# Patient Record
Sex: Female | Born: 1975
Health system: Southern US, Community
[De-identification: ages and names within clinical notes are randomized; demographics above are authoritative.]

## PROBLEM LIST (undated history)

## (undated) DIAGNOSIS — E039 Hypothyroidism, unspecified: Secondary | ICD-10-CM

## (undated) DIAGNOSIS — E559 Vitamin D deficiency, unspecified: Secondary | ICD-10-CM

## (undated) DIAGNOSIS — Z1371 Encounter for nonprocreative screening for genetic disease carrier status: Secondary | ICD-10-CM

## (undated) DIAGNOSIS — E049 Nontoxic goiter, unspecified: Secondary | ICD-10-CM

## (undated) DIAGNOSIS — Z803 Family history of malignant neoplasm of breast: Secondary | ICD-10-CM

## (undated) DIAGNOSIS — Z9189 Other specified personal risk factors, not elsewhere classified: Secondary | ICD-10-CM

## (undated) HISTORY — DX: Nontoxic goiter, unspecified: E04.9

## (undated) HISTORY — DX: Other specified personal risk factors, not elsewhere classified: Z91.89

## (undated) HISTORY — DX: Hypothyroidism, unspecified: E03.9

## (undated) HISTORY — DX: Family history of malignant neoplasm of breast: Z80.3

## (undated) HISTORY — DX: Vitamin D deficiency, unspecified: E55.9

## (undated) HISTORY — DX: Encounter for nonprocreative screening for genetic disease carrier status: Z13.71

---

## 2006-07-08 ENCOUNTER — Encounter: Payer: Self-pay | Admitting: Maternal & Fetal Medicine

## 2006-08-21 ENCOUNTER — Emergency Department: Payer: Self-pay | Admitting: Emergency Medicine

## 2006-08-30 ENCOUNTER — Emergency Department: Payer: Self-pay | Admitting: Emergency Medicine

## 2006-09-27 ENCOUNTER — Encounter: Payer: Self-pay | Admitting: Maternal & Fetal Medicine

## 2006-10-21 ENCOUNTER — Encounter: Payer: Self-pay | Admitting: Obstetrics and Gynecology

## 2006-11-11 ENCOUNTER — Encounter: Payer: Self-pay | Admitting: Obstetrics and Gynecology

## 2006-11-11 ENCOUNTER — Inpatient Hospital Stay: Payer: Self-pay

## 2007-01-31 HISTORY — PX: TUBAL LIGATION: SHX77

## 2007-07-27 ENCOUNTER — Other Ambulatory Visit: Payer: Self-pay

## 2007-07-28 ENCOUNTER — Inpatient Hospital Stay: Payer: Self-pay | Admitting: Internal Medicine

## 2007-07-28 ENCOUNTER — Inpatient Hospital Stay: Payer: Self-pay | Admitting: Psychiatry

## 2008-01-02 IMAGING — US US OB DETAIL+14 WK - NRPT [facility]
1 series · 14 of 28 positions shown · non-contrast
Comparison: none

[Series 1: us ob detail+14 wk - nrpt [facility] · 0.31mm/px · 14 of 73 slices shown]
[im 3/73]
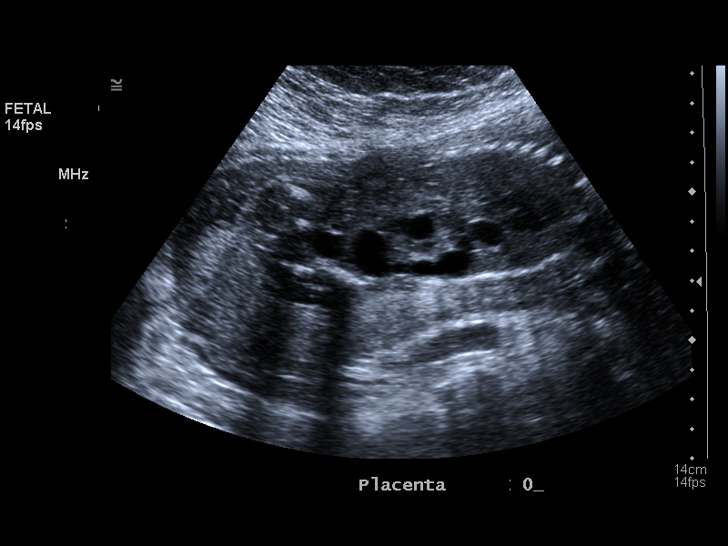
[im 9/73]
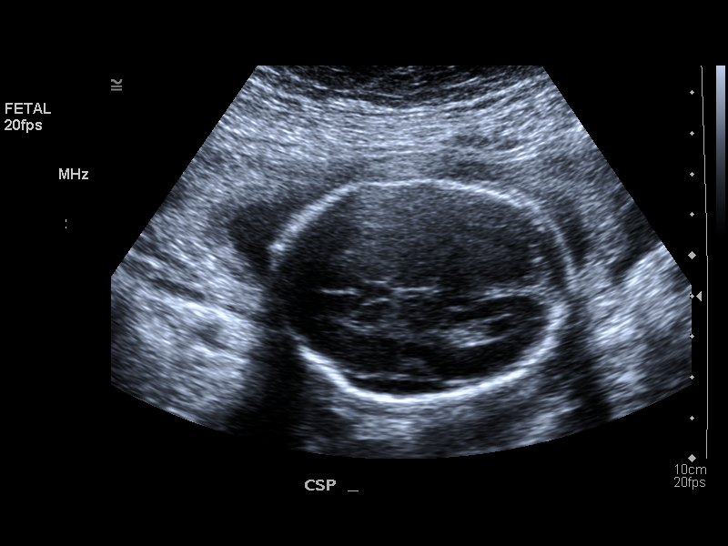
[im 14/73]
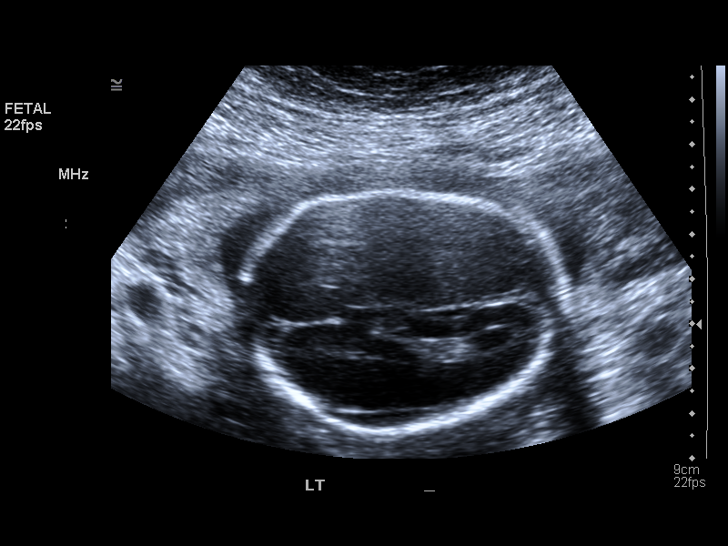
[im 19/73]
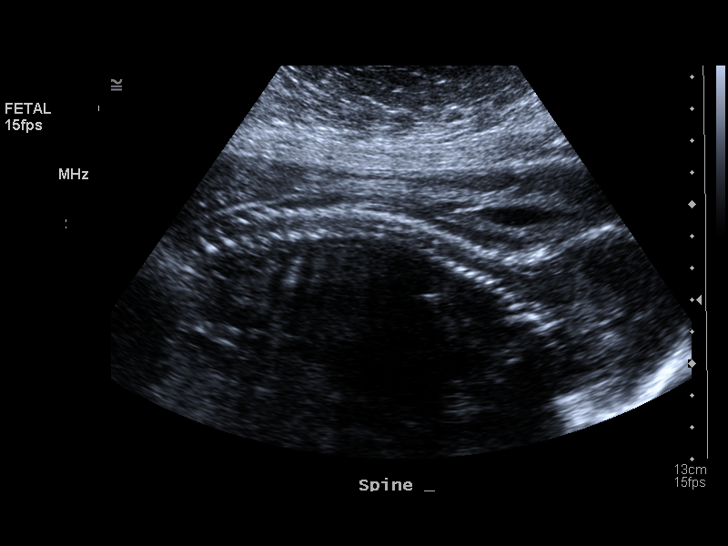
[im 25/73]
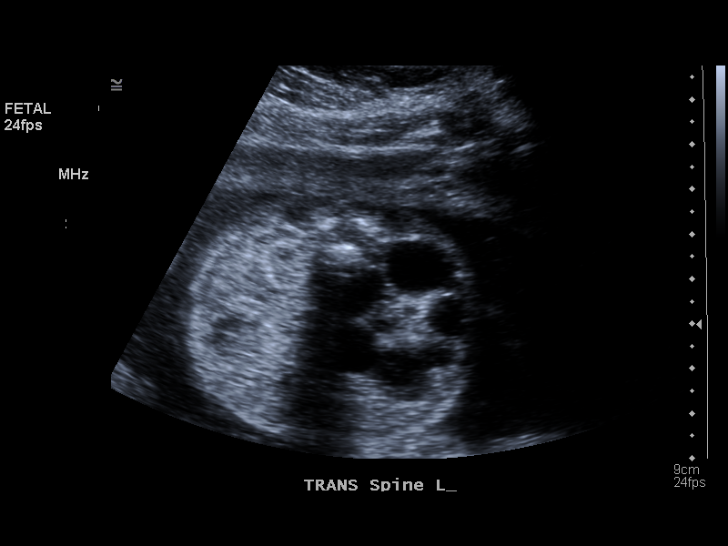
[im 30/73]
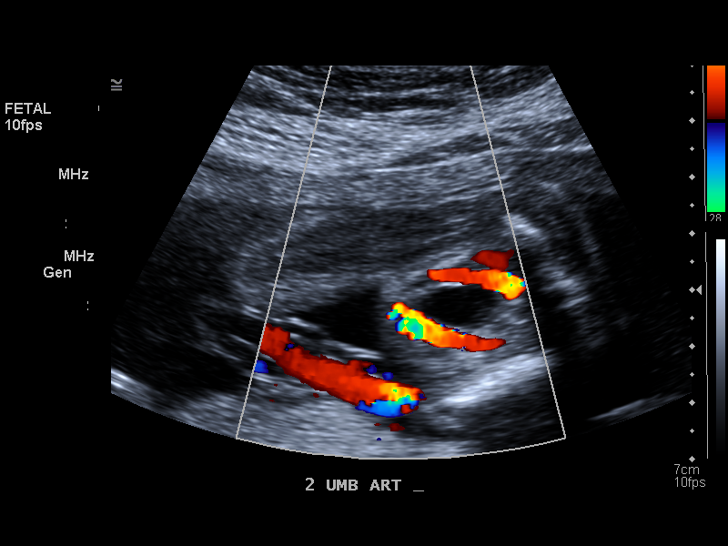
[im 35/73]
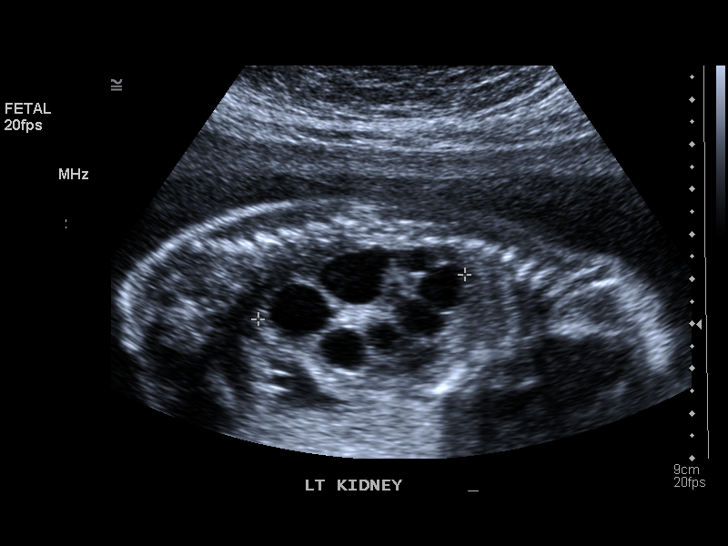
[im 41/73]
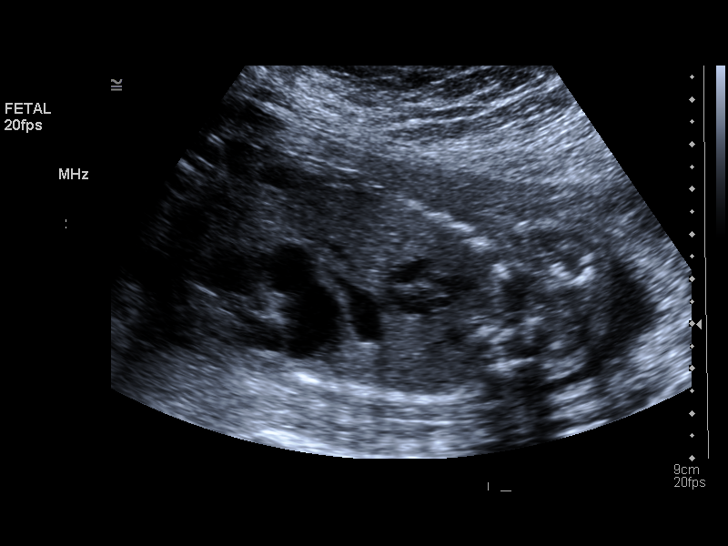
[im 46/73]
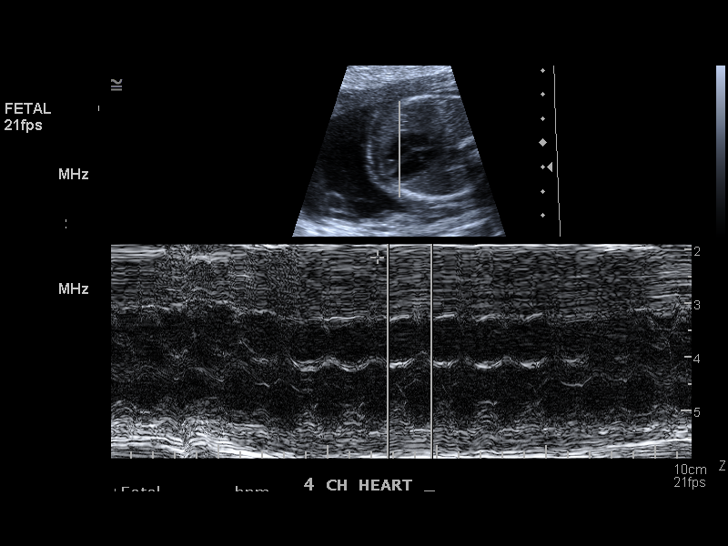
[im 51/73]
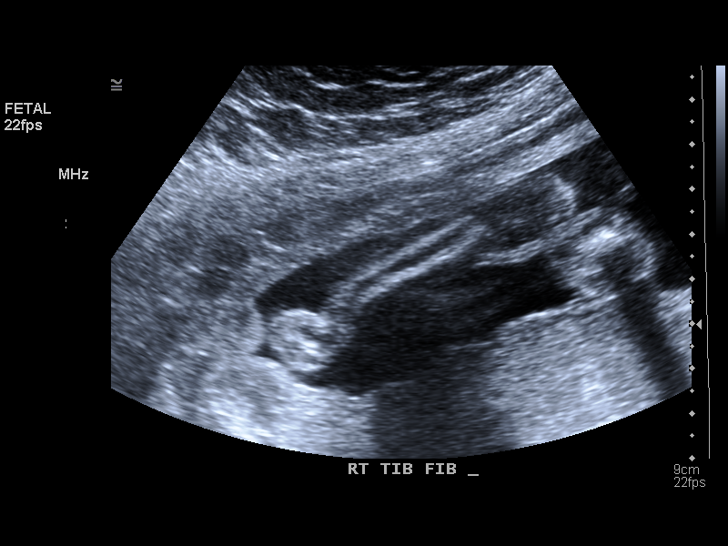
[im 57/73]
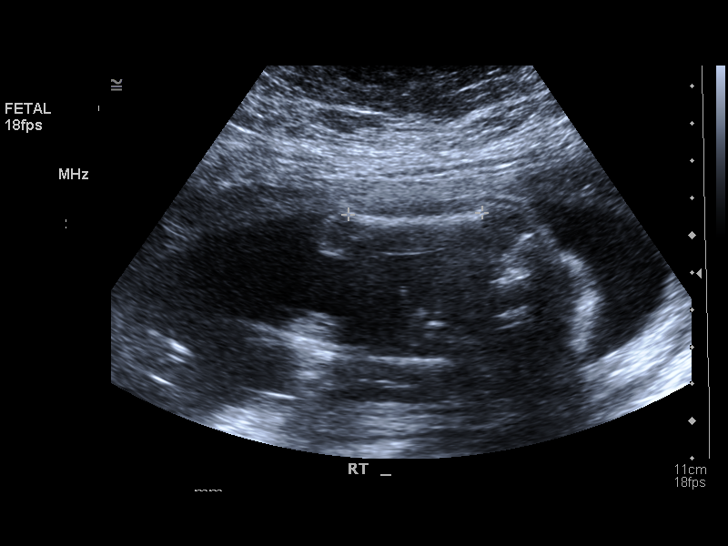
[im 62/73]
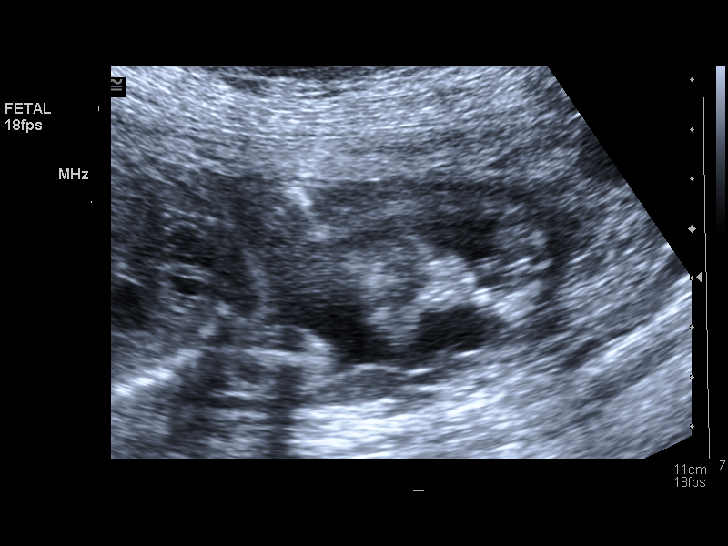
[im 67/73]
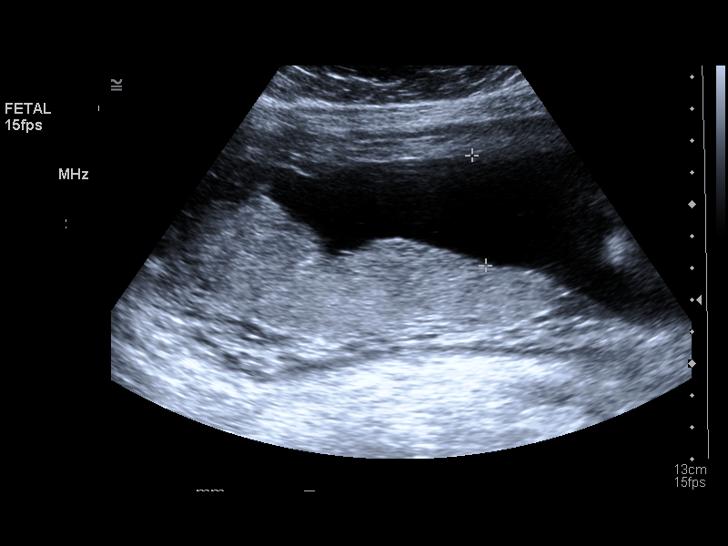
[im 73/73]
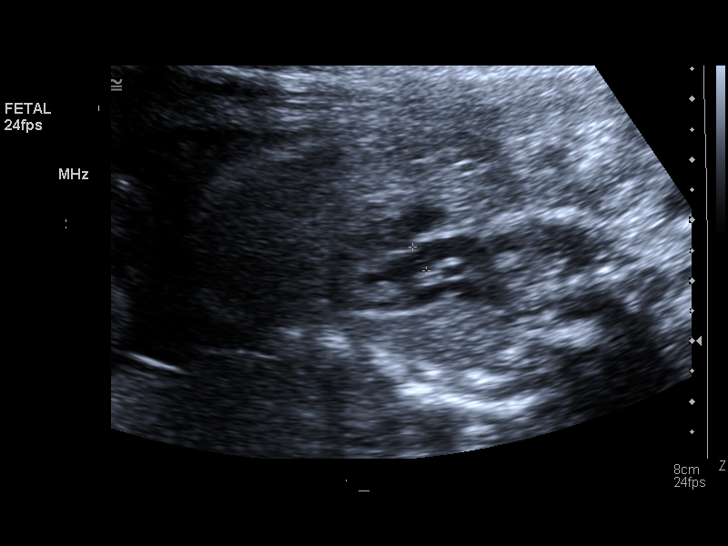

[14 of 28 positions shown; findings below may reference images not displayed]

IMAGES IMPORTED FROM THE [employer] WORKFLOW SYSTEM
NO DICTATION FOR STUDY

## 2008-05-07 IMAGING — US ULTRAOUND OB LIMITED - [facility]PT [facility]
1 series · 14 of 28 positions shown · non-contrast
Comparison: none

[Series 1: ultraound ob limited - [facility] · 0.41mm/px · 14 of 32 slices shown]
[im 2/32]
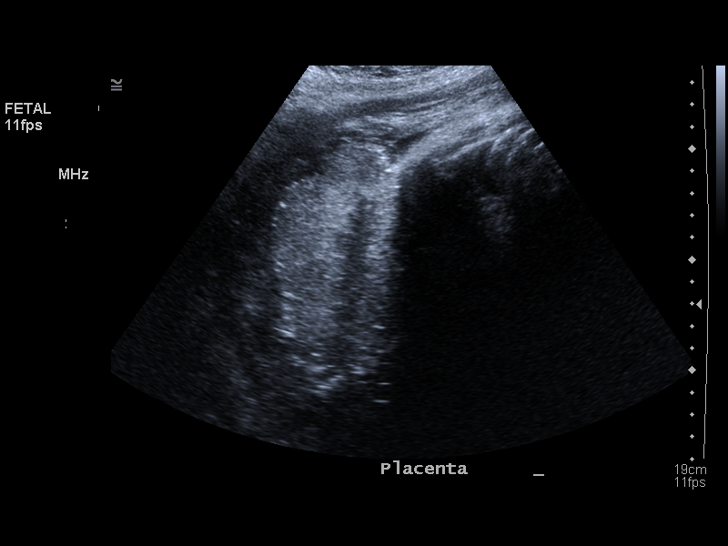
[im 4/32]
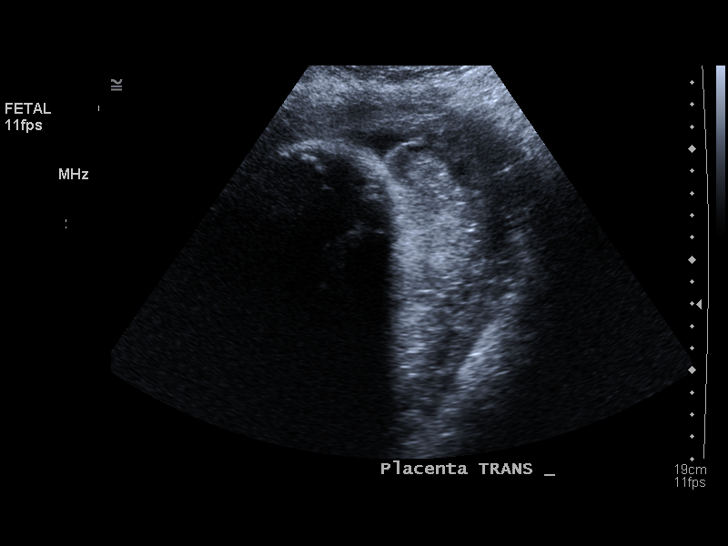
[im 6/32]
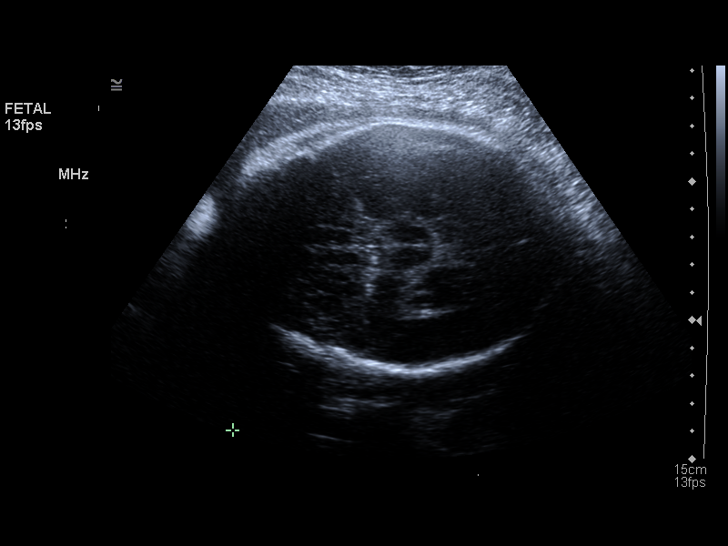
[im 9/32]
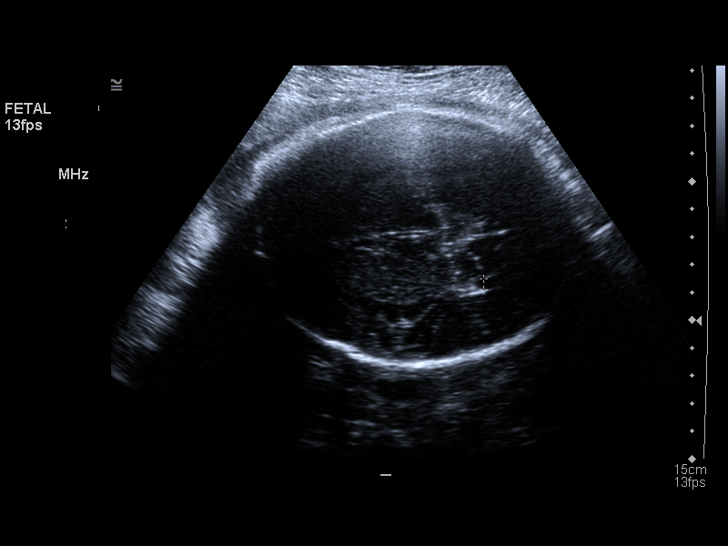
[im 11/32]
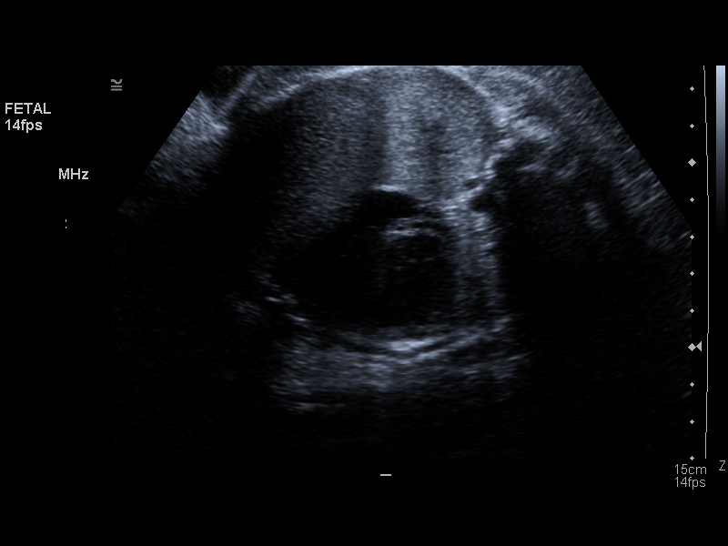
[im 13/32]
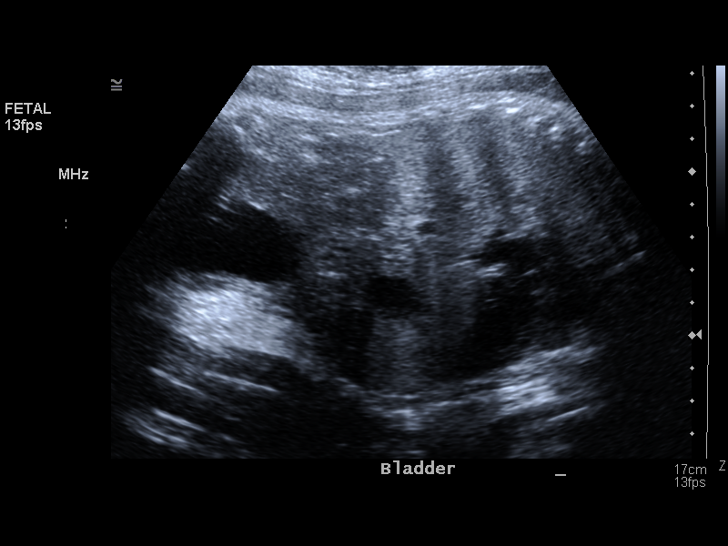
[im 15/32]
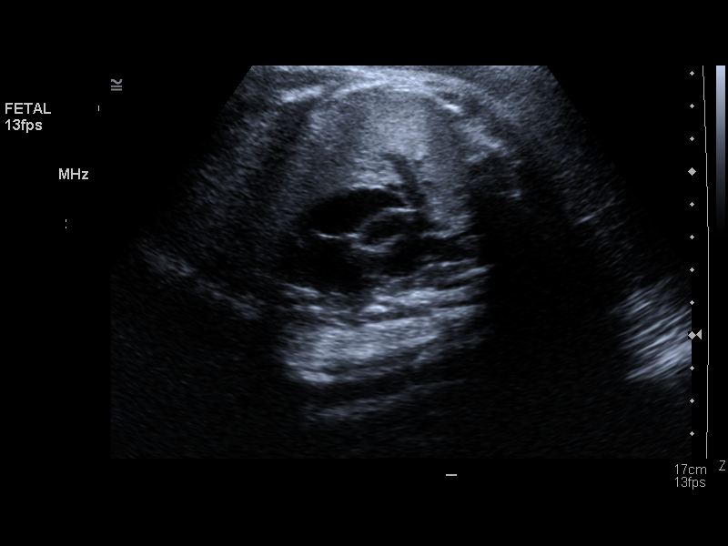
[im 18/32]
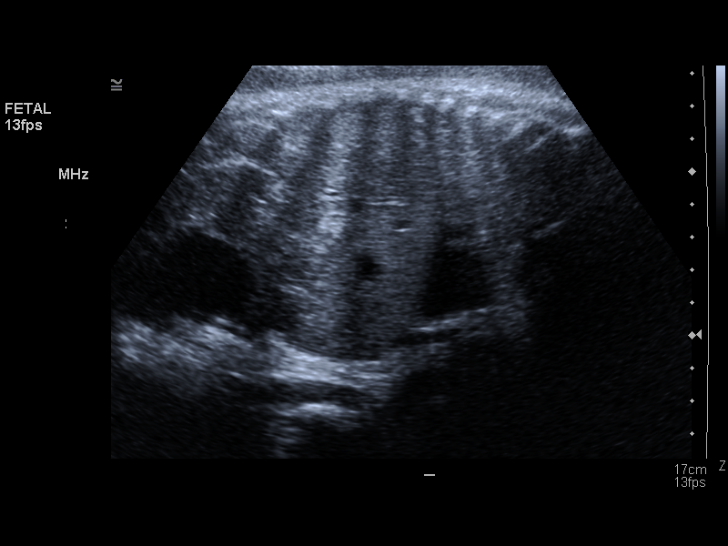
[im 20/32]
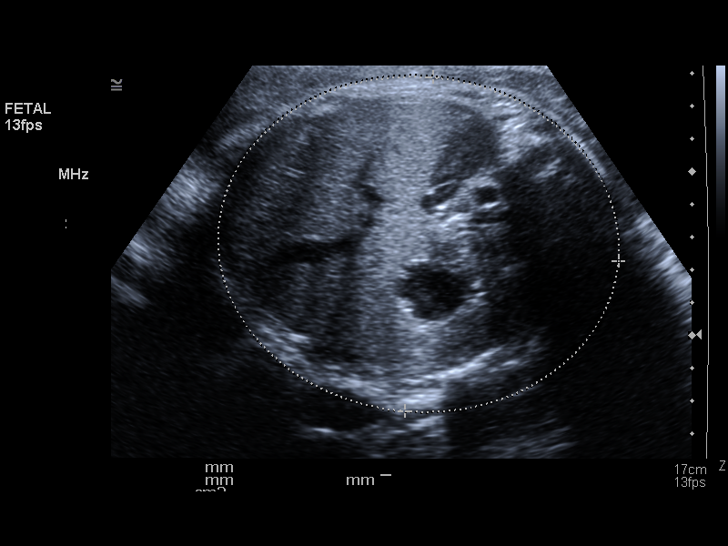
[im 22/32]
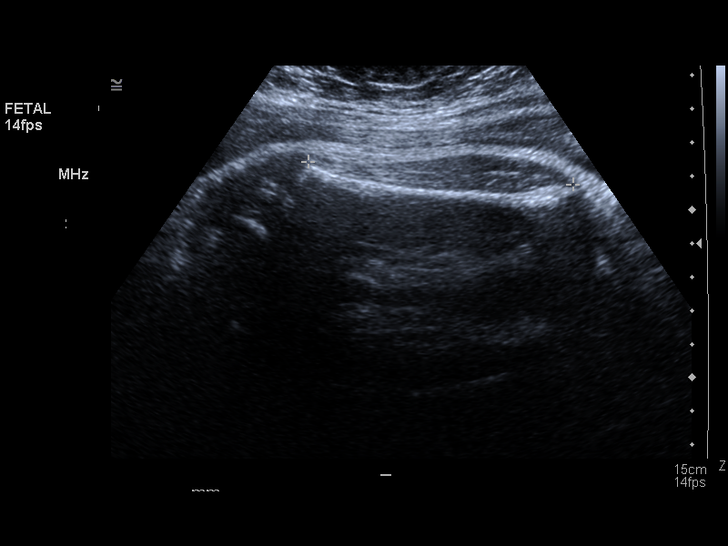
[im 25/32]
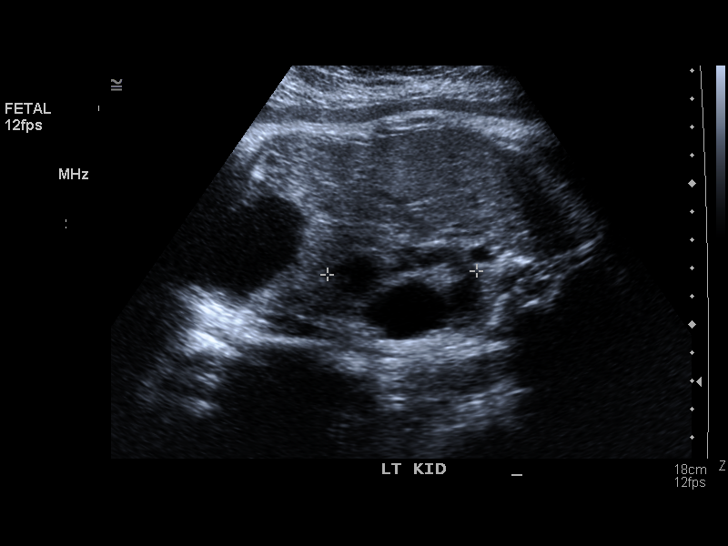
[im 27/32]
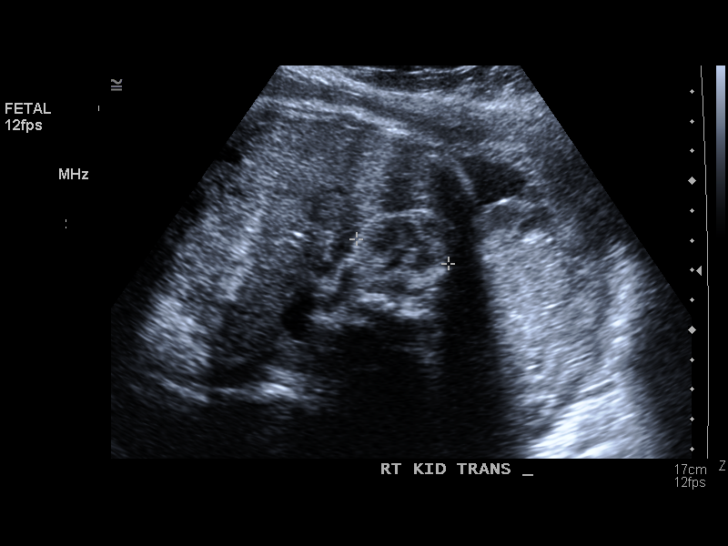
[im 29/32]
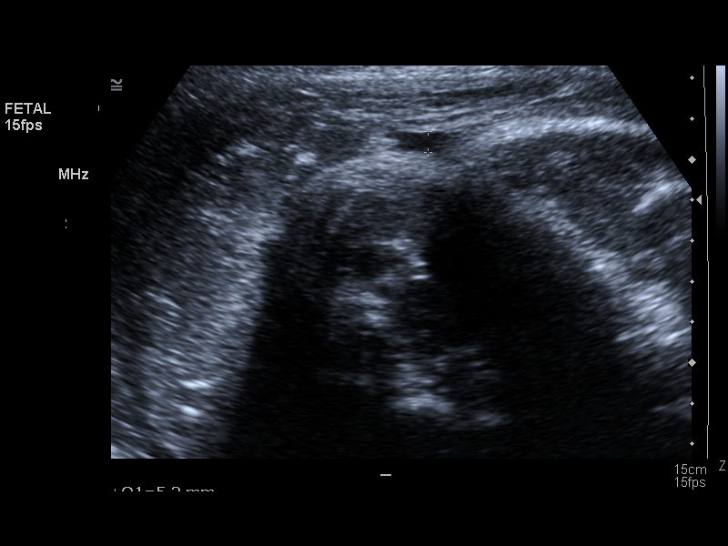
[im 32/32]
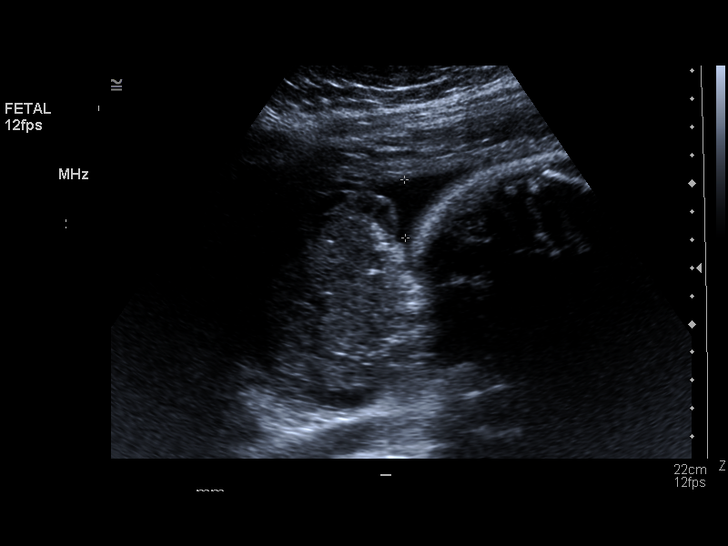

[14 of 28 positions shown; findings below may reference images not displayed]

IMAGES IMPORTED FROM THE SYNGO WORKFLOW SYSTEM
NO DICTATION FOR STUDY

## 2009-01-21 IMAGING — CR DG CHEST 1V PORT
1 series · 1 of 1 positions shown · non-contrast
Comparison: none

REASON FOR EXAM: overdose, rule out aspiration
COMMENTS:

[view not recorded]
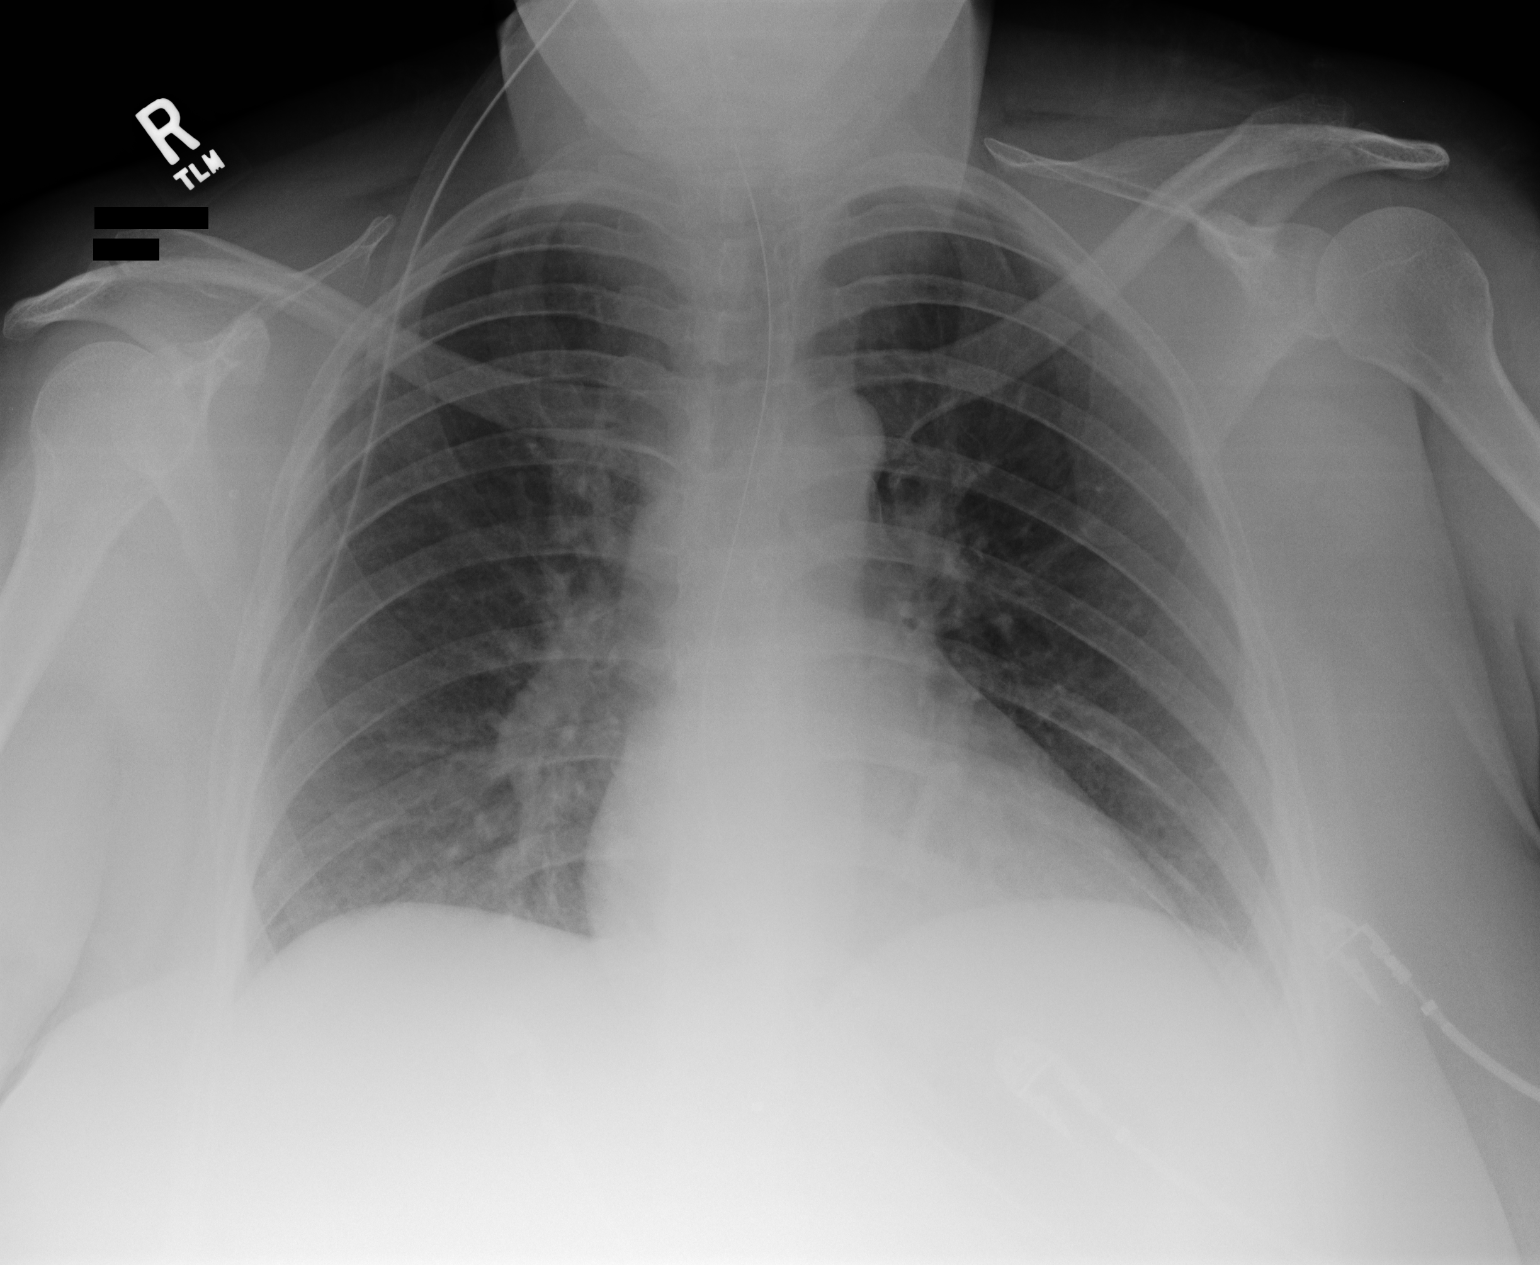

[1 of 1 positions shown; findings below may reference images not displayed]

PROCEDURE:     DXR - DXR PORTABLE CHEST SINGLE VIEW  - July 28, 2007 [DATE]

RESULT:     The lungs are adequately inflated. The heart is normal in size.
The perihilar lung markings are mildly increased. There is no pleural
effusion. There is an esophagogastric tube present whose tip is not visible
on the film.
IMPRESSION: Mildly increased perihilar lung markings are nonspecific
but could reflect subsegmental atelectasis. I see no evidence of focal
pneumonia. A followup PA and lateral chest x-ray would be of value when the
patient can tolerate the procedure.

## 2009-07-22 ENCOUNTER — Encounter: Payer: Self-pay | Admitting: Obstetrics & Gynecology

## 2010-02-05 ENCOUNTER — Observation Stay: Payer: Self-pay | Admitting: Obstetrics and Gynecology

## 2010-02-06 ENCOUNTER — Inpatient Hospital Stay: Payer: Self-pay | Admitting: Obstetrics and Gynecology

## 2010-02-11 LAB — PATHOLOGY REPORT

## 2011-03-03 DIAGNOSIS — Z9189 Other specified personal risk factors, not elsewhere classified: Secondary | ICD-10-CM

## 2011-03-03 DIAGNOSIS — Z1371 Encounter for nonprocreative screening for genetic disease carrier status: Secondary | ICD-10-CM

## 2011-03-03 HISTORY — DX: Encounter for nonprocreative screening for genetic disease carrier status: Z13.71

## 2011-03-03 HISTORY — DX: Other specified personal risk factors, not elsewhere classified: Z91.89

## 2011-06-01 DIAGNOSIS — Z803 Family history of malignant neoplasm of breast: Secondary | ICD-10-CM | POA: Insufficient documentation

## 2011-06-01 HISTORY — DX: Family history of malignant neoplasm of breast: Z80.3

## 2017-05-24 ENCOUNTER — Ambulatory Visit (INDEPENDENT_AMBULATORY_CARE_PROVIDER_SITE_OTHER): Payer: BC Managed Care – PPO | Admitting: Obstetrics and Gynecology

## 2017-05-24 ENCOUNTER — Encounter: Payer: Self-pay | Admitting: Obstetrics and Gynecology

## 2017-05-24 VITALS — BP 140/98 | HR 94 | Ht 69.0 in | Wt 276.0 lb

## 2017-05-24 DIAGNOSIS — R2232 Localized swelling, mass and lump, left upper limb: Secondary | ICD-10-CM

## 2017-05-24 DIAGNOSIS — Z803 Family history of malignant neoplasm of breast: Secondary | ICD-10-CM | POA: Diagnosis not present

## 2017-05-24 DIAGNOSIS — Z9189 Other specified personal risk factors, not elsewhere classified: Secondary | ICD-10-CM

## 2017-05-24 DIAGNOSIS — Z1239 Encounter for other screening for malignant neoplasm of breast: Secondary | ICD-10-CM

## 2017-05-24 DIAGNOSIS — E039 Hypothyroidism, unspecified: Secondary | ICD-10-CM | POA: Insufficient documentation

## 2017-05-24 DIAGNOSIS — Z1231 Encounter for screening mammogram for malignant neoplasm of breast: Secondary | ICD-10-CM | POA: Diagnosis not present

## 2017-05-24 NOTE — Progress Notes (Signed)
Copland, Briana Evener, PA-C   Chief Complaint  Patient presents with  . Breast Problem    Left lump towards under arm, sore    HPI:      [name] is a 42 y.o. [phone] who LMP was Patient's last menstrual period was 05/02/2017 (exact date)., presents today for LT axillary mass for the past few wks. Area is tender which triggered pt to notice sx. No change since first noted. No erythema/nipple d/c. She does drink caffeine. She has not had a mammogram since 12/05/14. There is a FH of breast cancer in her mom, MGM, and mat aunt. Pt and mat aunt are BRCA neg 2013. Pt hasn't had update testing. IBIS=25% 2013.   Past Medical History:  Diagnosis Date  . BRCA negative 2013   BRCA/BART neg  . Enlarged thyroid   . Family history of breast cancer 06/2011   Pt is BRCA/BART neg. IBIS=25.85% QYR mammogram/6 mo breast check MRI QYR; Mat aunt BRCA neg  . Hypothyroidism   . Increased risk of breast cancer 2013   IBIS-25%  . Vitamin D deficiency     Past Surgical History:  Procedure Laterality Date  . TUBAL LIGATION  01/2007    Family History  Problem Relation Age of Onset  . Breast cancer Mother 67  . Brain cancer Mother 36  . Breast cancer Maternal Aunt 42  . Breast cancer Maternal Grandmother 47  . Hypertension Father   . Diabetes Father   . Pancreatic cancer Maternal Uncle     Social History   Socioeconomic History  . Marital status: Married    Spouse name: Not on file  . Number of children: Not on file  . Years of education: Not on file  . Highest education level: Not on file  Occupational History  . Not on file  Social Needs  . Financial resource strain: Not on file  . Food insecurity:    Worry: Not on file    Inability: Not on file  . Transportation needs:    Medical: Not on file    Non-medical: Not on file  Tobacco Use  . Smoking status: Never Smoker  . Smokeless tobacco: Never Used  Substance and Sexual Activity  . Alcohol use: Not Currently   Frequency: Never  . Drug use: Never  . Sexual activity: Yes    Birth control/protection: Surgical    Comment: tubal   Lifestyle  . Physical activity:    Days per week: Not on file    Minutes per session: Not on file  . Stress: Not on file  Relationships  . Social connections:    Talks on phone: Not on file    Gets together: Not on file    Attends religious service: Not on file    Active member of club or organization: Not on file    Attends meetings of clubs or organizations: Not on file    Relationship status: Not on file  . Intimate partner violence:    Fear of current or ex partner: Not on file    Emotionally abused: Not on file    Physically abused: Not on file    Forced sexual activity: Not on file  Other Topics Concern  . Not on file  Social History Narrative  . Not on file    Outpatient Medications Prior to Visit  Medication Sig Dispense Refill  . ibuprofen (ADVIL,MOTRIN) 200 MG tablet Take by mouth.     No facility-administered  medications prior to visit.       ROS:  Review of Systems  Constitutional: Negative for fever.  Gastrointestinal: Negative for blood in stool, constipation, diarrhea, nausea and vomiting.  Genitourinary: Negative for dyspareunia, dysuria, flank pain, frequency, hematuria, urgency, vaginal bleeding, vaginal discharge and vaginal pain.  Musculoskeletal: Negative for back pain.  Skin: Negative for rash.   BREAST: mass/tenderness   OBJECTIVE:   Vitals:  BP (!) 140/98   Pulse 94   Ht '5\' 9"'$  (1.753 m)   Wt 276 lb (125.2 kg)   LMP 05/02/2017 (Exact Date)   BMI 40.76 kg/m   Physical Exam  Constitutional: She is oriented to person, place, and time and well-developed, well-nourished, and in no distress.  Pulmonary/Chest: Right breast exhibits no inverted nipple, no mass, no nipple discharge, no skin change and no tenderness. Left breast exhibits mass. Left breast exhibits no inverted nipple, no nipple discharge, no skin change and no  tenderness. Breasts are symmetrical.    LT BREAST AXILLARY TAIL WITH ~7 MM FIRM, MOBILE, SUPERFICIAL NODULE; TENDER TO PALPATE; NO ERYTHEMA  Lymphadenopathy:    She has no axillary adenopathy.  Neurological: She is alert and oriented to person, place, and time.  Psychiatric: Affect and judgment normal.  Vitals reviewed.   Assessment/Plan: Axillary mass, left - Feels like cystic nodule. Warm compresses/d/c shaving there. F/u prn/ at annual.  Screening for breast cancer - Pt to sched mammo since overdue.  - Plan: MM SCREENING BREAST TOMO BILATERAL  Increased risk of breast cancer - IBIS=25%. Do mammo. Pt to RTO for annual/discussion of further scr/update cancer genetic testing. - Plan: MM SCREENING BREAST TOMO BILATERAL  Family history of breast cancer - Plan: MM SCREENING BREAST TOMO BILATERAL    Return in about 1 month (around 06/10/3011) for annual.  [name], PA-C 05/24/2017 12:08 PM

## 2017-05-24 NOTE — Patient Instructions (Signed)
I value your feedback and entrusting us with your care. If you get a [place] patient survey, I would appreciate you taking the time to let us know about your experience today. Thank you! 

## 2017-06-23 ENCOUNTER — Ambulatory Visit: Payer: BC Managed Care – PPO | Admitting: Obstetrics and Gynecology

## 2018-02-05 ENCOUNTER — Emergency Department: Payer: BLUE CROSS/BLUE SHIELD

## 2018-02-05 ENCOUNTER — Emergency Department
Admission: EM | Admit: 2018-02-05 | Discharge: 2018-02-05 | Disposition: A | Discharge status: Home / Self Care | Payer: BLUE CROSS/BLUE SHIELD | Attending: Emergency Medicine | Admitting: Emergency Medicine

## 2018-02-05 ENCOUNTER — Other Ambulatory Visit: Payer: Self-pay

## 2018-02-05 ENCOUNTER — Encounter: Payer: Self-pay | Admitting: Emergency Medicine

## 2018-02-05 DIAGNOSIS — Y9301 Activity, walking, marching and hiking: Secondary | ICD-10-CM | POA: Insufficient documentation

## 2018-02-05 DIAGNOSIS — Y92008 Other place in unspecified non-institutional (private) residence as the place of occurrence of the external cause: Secondary | ICD-10-CM | POA: Diagnosis not present

## 2018-02-05 DIAGNOSIS — R51 Headache: Secondary | ICD-10-CM | POA: Diagnosis not present

## 2018-02-05 DIAGNOSIS — Y999 Unspecified external cause status: Secondary | ICD-10-CM | POA: Insufficient documentation

## 2018-02-05 DIAGNOSIS — S199XXA Unspecified injury of neck, initial encounter: Secondary | ICD-10-CM | POA: Diagnosis present

## 2018-02-05 DIAGNOSIS — W108XXA Fall (on) (from) other stairs and steps, initial encounter: Secondary | ICD-10-CM | POA: Diagnosis not present

## 2018-02-05 DIAGNOSIS — E039 Hypothyroidism, unspecified: Secondary | ICD-10-CM | POA: Diagnosis not present

## 2018-02-05 DIAGNOSIS — S134XXA Sprain of ligaments of cervical spine, initial encounter: Secondary | ICD-10-CM

## 2018-02-05 DIAGNOSIS — S63501A Unspecified sprain of right wrist, initial encounter: Secondary | ICD-10-CM

## 2018-02-05 MED ORDER — OXYCODONE-ACETAMINOPHEN 5-325 MG PO TABS
1.0000 | ORAL_TABLET | Freq: Once | ORAL | Status: AC
  Administered 2018-02-05: 1 via ORAL
  Filled 2018-02-05: qty 1

## 2018-02-05 NOTE — ED Provider Notes (Signed)
[facility] Emergency Department Provider Note  ___________________________________________   First MD Initiated Contact with Patient 02/05/18 [phone]     (approximate)  I have reviewed the triage vital signs and the nursing notes.   HISTORY  Chief Complaint Wrist Pain   HPI Briana Thompson is a 42 y.o. female without any chronic medical conditions who is not on any blood thinners who is presenting after a fall.  She says that she had fallen at her home off of a staircase and fell to the ground onto the dirt.  Said that the total height of the fall was approximately 3 to 4 feet and that she hit the right side of her head first.  She did not lose consciousness.  Is having left-sided neck pain.  Not reporting headache at this time.  Thinks that she also fell on her right wrist and has swelling dorsally to the right wrist.  Says that her pain is approximately a 7 out of 10 at this time.  Denies any focal weakness or numbness.  Denies any chest pain or left-sided pain.  Able to ambulate into the emergency department.  Placed in a cervical collar.  Says that she has no pain in her neck when her head and neck are still.  Past Medical History:  Diagnosis Date  . BRCA negative 2013   BRCA/BART neg  . Enlarged thyroid   . Family history of breast cancer 06/2011   Pt is BRCA/BART neg. IBIS=25.85% QYR mammogram/6 mo breast check MRI QYR; Mat aunt BRCA neg  . Hypothyroidism   . Increased risk of breast cancer 2013   IBIS-25%  . Vitamin D deficiency     Patient Active Problem List   Diagnosis Date Noted  . Hypothyroidism   . Family history of breast cancer 06/01/2011  . Increased risk of breast cancer 03/03/2011  . BRCA negative 03/03/2011    Past Surgical History:  Procedure Laterality Date  . TUBAL LIGATION  01/2007    Prior to Admission medications   Medication Sig Start Date End Date Taking? Authorizing Provider  ibuprofen (ADVIL,MOTRIN) 200 MG tablet  Take by mouth every 6 (six) hours as needed.     [provider]    Allergies Patient has no known allergies.  Family History  Problem Relation Age of Onset  . Breast cancer Mother 27  . Brain cancer Mother 27  . Breast cancer Maternal Aunt 42  . Breast cancer Maternal Grandmother 43  . Hypertension Father   . Diabetes Father   . Pancreatic cancer Maternal Uncle     Social History Social History   Tobacco Use  . Smoking status: Never Smoker  . Smokeless tobacco: Never Used  Substance Use Topics  . Alcohol use: Not Currently    Frequency: Never  . Drug use: Never    Review of Systems  Constitutional: No fever/chills Eyes: No visual changes. ENT: No sore throat. Cardiovascular: Denies chest pain. Respiratory: Denies shortness of breath. Gastrointestinal: No abdominal pain.  No nausea, no vomiting.  No diarrhea.  No constipation. Genitourinary: Negative for dysuria. Musculoskeletal: Negative for back pain. Skin: Negative for rash. Neurological: Negative for headaches, focal weakness or numbness.   ____________________________________________   PHYSICAL EXAM:  VITAL SIGNS: ED Triage Vitals  Enc Vitals Group     BP 02/05/18 0954 139/72     Pulse Rate 02/05/18 0954 83     Resp 02/05/18 0954 18     Temp 02/05/18 0954 98.4  F (36.9 C)     Temp Source 02/05/18 0954 Oral     SpO2 02/05/18 0954 100 %     Weight 02/05/18 0955 275 lb (124.7 kg)     Height 02/05/18 0955 '5\' 9"'$  (1.753 m)     Head Circumference --      Peak Flow --      Pain Score 02/05/18 0954 7     Pain Loc --      Pain Edu? --      Excl. in [place]? --     Constitutional: Alert and oriented. Well appearing and in no acute distress. Eyes: Conjunctivae are normal.  Head: Atraumatic.  Very superficial abrasions to the right forehead.  No tenderness over the facial bones. Nose: No congestion/rhinnorhea. Mouth/Throat: Mucous membranes are moist.  Neck: No stridor.  Wearing cervical collar.   Tenderness palpation along the left-sided trapezius.  No midline tenderness along the cervical spine.  No deformity or step-off. Cardiovascular: Normal rate, regular rhythm. Grossly normal heart sounds.   Respiratory: Normal respiratory effort.  No retractions. Lungs CTAB. Gastrointestinal: Soft and nontender. No distention.  Musculoskeletal: No lower extremity tenderness nor edema.  No joint effusions.  Right wrist dorsum with mild swelling.  Able to range actively but with limited range of motion secondary to pain.  No volar tenderness nor is there any volar swelling.  Distal, there are no signs of deformity, redness to the hand or fingers.  Brisk capillary refill to the fingertips.  Sensation is intact to light touch throughout.  5 out of 5 grip strength.  No snuffbox tenderness nor is there pain to axial load.  Neurologic:  Normal speech and language. No gross focal neurologic deficits are appreciated. Skin:  Skin is warm, dry and intact. No rash noted. Psychiatric: Mood and affect are normal. Speech and behavior are normal.  ____________________________________________   LABS (all labs ordered are listed, but only abnormal results are displayed)  Labs Reviewed - No data to display ____________________________________________  EKG   ____________________________________________  RADIOLOGY  CT head neck without acute intracranial or other serious injury.  X-ray of the right wrist without evidence of fracture dislocation.  Tiny radiopaque foreign body adjacent to the proximal fifth metacarpal. ____________________________________________   PROCEDURES  Procedure(s) performed:   Procedures  Critical Care performed:   ____________________________________________   INITIAL IMPRESSION / ASSESSMENT AND PLAN / ED COURSE  Pertinent labs & imaging results that were available during my care of the patient were reviewed by me and considered in my medical decision making (see chart  for details).  DDX: Wrist fracture, wrist sprain, whiplash injury, cervical spine fracture, concussion, intracranial hemorrhage, skull fracture As part of my medical decision making, I reviewed the following data within the [place] from outpatient visits  ----------------------------------------- 1:57 PM on 02/05/2018 -----------------------------------------  Patient placed in a spica splint on the right side and is neurovascularly intact.  Splinted for comfort.  Will follow-up with primary care.  Examined again and without snuffbox tenderness or tenderness that appears to be near the scaphoid.  Updated regarding imaging as well as treatment plan.  Patient be discharged at this time.  ____________________________________________   FINAL CLINICAL IMPRESSION(S) / ED DIAGNOSES  Mechanical fall.  Whiplash injury.  Wrist sprain.  NEW MEDICATIONS STARTED DURING THIS VISIT:  New Prescriptions   No medications on file     Note:  This document was prepared using Dragon voice recognition software and may include unintentional dictation errors.  Orbie Pyo, MD 02/05/18 [phone]

## 2018-02-05 NOTE — ED Notes (Signed)
C-Collar in place at this time.  

## 2018-02-05 NOTE — ED Notes (Signed)
Pt states she fell down seven steps and hit right side of head. Pt c/o headache at this time, but denies LOC at time of fall. Pt c/o neck pain with movement. Pt right wrist notably swollen and tender to touch. Pt able to move all fingers in right hand with full sensation. C-collar placed on pt at this time. MD notified.

## 2018-02-05 NOTE — ED Triage Notes (Signed)
Pt to ed with c/o fall this am.  Pt reports pain to right wrist and neck pain and headache.  Pt states she fell down 7 steps after tripping.  Pt alert and oriented at this time.

## 2018-02-05 NOTE — ED Notes (Signed)
Patient transported to CT 

## 2018-02-05 NOTE — ED Notes (Signed)
C collar removed

## 2018-02-05 NOTE — ED Notes (Signed)
Report given to Tonya RN

## 2018-04-01 ENCOUNTER — Ambulatory Visit
Admission: EM | Admit: 2018-04-01 | Discharge: 2018-04-01 | Disposition: A | Discharge status: Home / Self Care | Payer: BLUE CROSS/BLUE SHIELD | Attending: Family Medicine | Admitting: Family Medicine

## 2018-04-01 ENCOUNTER — Other Ambulatory Visit: Payer: Self-pay

## 2018-04-01 DIAGNOSIS — B9789 Other viral agents as the cause of diseases classified elsewhere: Secondary | ICD-10-CM | POA: Diagnosis not present

## 2018-04-01 DIAGNOSIS — J069 Acute upper respiratory infection, unspecified: Secondary | ICD-10-CM

## 2018-04-01 MED ORDER — HYDROCOD POLST-CPM POLST ER 10-8 MG/5ML PO SUER: 5.0000 mL | Freq: Every evening | ORAL | 0 refills | Status: DC | PRN

## 2018-04-01 MED ORDER — IPRATROPIUM BROMIDE 0.06 % NA SOLN: 2.0000 | Freq: Four times a day (QID) | NASAL | 0 refills | Status: DC | PRN

## 2018-04-01 MED ORDER — BENZONATATE 100 MG PO CAPS: 100.0000 mg | ORAL_CAPSULE | Freq: Three times a day (TID) | ORAL | 0 refills | Status: DC | PRN

## 2018-04-01 NOTE — ED Triage Notes (Addendum)
Pt with cough, runny nose, low energy due to lack of sleep (up coughing during the night) Sore throat and sinus pressure. Hurts into chest when coughing and coughing worse at night. No fever. Pain 5/10. Pt endorses sx since Monday and starting to feel better other than the cough.

## 2018-04-01 NOTE — Discharge Instructions (Signed)
Rest. Fluids.  Medication as prescribed.   Take care  Dr. Evani Shrider  

## 2018-04-01 NOTE — ED Provider Notes (Signed)
[facility]    CSN: [id number] Arrival date & time: 04/01/18  0904  History   Chief Complaint Chief Complaint  Patient presents with  . Cough  . Nasal Congestion   HPI  43 year old female presents with respiratory symptoms.  Started Monday with cough.  Then subsequently developed fatigue, runny nose, headache, sore throat.  She is taking NyQuil without significant improvement.  Cough is her predominant complaint at this time.  Worse at night.  Interfering with sleep.  Husband is also sick.  No relieving factors.  No documented fevers.  No other associated symptoms.  No other complaints.  PMH, Surgical Hx, Family Hx, Social History reviewed and updated as below.  Past Medical History:  Diagnosis Date  . BRCA negative 2013   BRCA/BART neg  . Enlarged thyroid   . Family history of breast cancer 06/2011   Pt is BRCA/BART neg. IBIS=25.85% QYR mammogram/6 mo breast check MRI QYR; Mat aunt BRCA neg  . Hypothyroidism   . Increased risk of breast cancer 2013   IBIS-25%  . Vitamin D deficiency    Patient Active Problem List   Diagnosis Date Noted  . Hypothyroidism   . Family history of breast cancer 06/01/2011  . Increased risk of breast cancer 03/03/2011  . BRCA negative 03/03/2011   Past Surgical History:  Procedure Laterality Date  . TUBAL LIGATION  01/2007   OB History    Gravida  3   Para  3   Term  3   Preterm      AB      Living  3     SAB      TAB      Ectopic      Multiple      Live Births  3          Home Medications    Prior to Admission medications   Medication Sig Start Date End Date Taking? Authorizing Provider  benzonatate (TESSALON) 100 MG capsule Take 1 capsule (100 mg total) by mouth 3 (three) times daily as needed. 04/01/18   Coral Spikes, DO  chlorpheniramine-HYDROcodone (TUSSIONEX PENNKINETIC ER) 10-8 MG/5ML SUER Take 5 mLs by mouth at bedtime as needed. 04/01/18   Coral Spikes, DO  ibuprofen (ADVIL,MOTRIN) 200 MG  tablet Take by mouth every 6 (six) hours as needed.     [provider]  ipratropium (ATROVENT) 0.06 % nasal spray Place 2 sprays into both nostrils 4 (four) times daily as needed for rhinitis. 04/01/18   Coral Spikes, DO    Family History Family History  Problem Relation Age of Onset  . Breast cancer Mother 65  . Brain cancer Mother 64  . Breast cancer Maternal Aunt 42  . Breast cancer Maternal Grandmother 28  . Hypertension Father   . Diabetes Father   . Pancreatic cancer Maternal Uncle     Social History Social History   Tobacco Use  . Smoking status: Never Smoker  . Smokeless tobacco: Never Used  Substance Use Topics  . Alcohol use: Yes    Frequency: Never    Comment: rare  . Drug use: Never     Allergies   Patient has no known allergies.   Review of Systems Review of Systems  Constitutional: Negative for fever.  HENT: Positive for congestion and rhinorrhea.   Respiratory: Positive for cough.   Psychiatric/Behavioral: Positive for sleep disturbance.   Physical Exam Triage Vital Signs ED Triage Vitals [04/01/18 0914]  Enc  Vitals Group     BP 135/86     Pulse Rate 100     Resp 16     Temp 98 F (36.7 C)     Temp Source Oral     SpO2 100 %     Weight 270 lb (122.5 kg)     Height 5' 9" (1.753 m)     Head Circumference      Peak Flow      Pain Score 5     Pain Loc      Pain Edu?      Excl. in [place]?    Updated Vital Signs BP 135/86 (BP Location: Left Arm)   Pulse 100   Temp 98 F (36.7 C) (Oral)   Resp 16   Ht 5' 9" (1.753 m)   Wt 122.5 kg   LMP 03/25/2018   SpO2 100%   BMI 39.87 kg/m   Visual Acuity Right Eye Distance:   Left Eye Distance:   Bilateral Distance:    Right Eye Near:   Left Eye Near:    Bilateral Near:     Physical Exam Vitals signs and nursing note reviewed.  Constitutional:      General: She is not in acute distress.    Appearance: Normal appearance.  HENT:     Head: Normocephalic and atraumatic.      Mouth/Throat:     Mouth: Mucous membranes are moist.     Pharynx: Oropharynx is clear. No posterior oropharyngeal erythema.  Eyes:     General:        Right eye: No discharge.        Left eye: No discharge.     Conjunctiva/sclera: Conjunctivae normal.  Neck:     Musculoskeletal: Neck supple.  Cardiovascular:     Rate and Rhythm: Normal rate and regular rhythm.  Pulmonary:     Effort: Pulmonary effort is normal.     Breath sounds: Normal breath sounds.  Lymphadenopathy:     Cervical: No cervical adenopathy.  Neurological:     Mental Status: She is alert.  Psychiatric:        Mood and Affect: Mood normal.        Behavior: Behavior normal.    UC Treatments / Results  Labs (all labs ordered are listed, but only abnormal results are displayed) Labs Reviewed - No data to display  EKG None  Radiology No results found.  Procedures Procedures (including critical care time)  Medications Ordered in UC Medications - No data to display  Initial Impression / Assessment and Plan / UC Course  I have reviewed the triage vital signs and the nursing notes.  Pertinent labs & imaging results that were available during my care of the patient were reviewed by me and considered in my medical decision making (see chart for details).    43 year old female presents with a viral URI with cough.  Rest, fluids.  Atrovent nasal spray.  Tessalon Perles and Tessalon for cough.  Work note given.  Final Clinical Impressions(s) / UC Diagnoses   Final diagnoses:  Viral URI with cough     Discharge Instructions     Rest.   Fluids.  Medication as prescribed.  Take care  Dr. Lacinda Axon    ED Prescriptions    Medication Sig Dispense Auth. Provider   ipratropium (ATROVENT) 0.06 % nasal spray Place 2 sprays into both nostrils 4 (four) times daily as needed for rhinitis. 15 mL Coral Spikes, DO  benzonatate (TESSALON) 100 MG capsule Take 1 capsule (100 mg total) by mouth 3 (three) times daily  as needed. 30 capsule [place], [place] G, DO   chlorpheniramine-HYDROcodone (TUSSIONEX PENNKINETIC ER) 10-8 MG/5ML SUER Take 5 mLs by mouth at bedtime as needed. 60 mL Coral Spikes, DO     Controlled Substance Prescriptions [place] Controlled Substance Registry consulted? Not Applicable   Coral Spikes, [place] 04/01/18 [id number]

## 2022-04-27 ENCOUNTER — Inpatient Hospital Stay
Admission: EM | Admit: 2022-04-27 | Discharge: 2022-04-30 | DRG: 812 | Disposition: A | Discharge status: Home / Self Care | Payer: BC Managed Care – PPO | Attending: Obstetrics and Gynecology | Admitting: Obstetrics and Gynecology

## 2022-04-27 ENCOUNTER — Other Ambulatory Visit: Payer: Self-pay | Admitting: Certified Nurse Midwife

## 2022-04-27 ENCOUNTER — Other Ambulatory Visit: Payer: Self-pay

## 2022-04-27 DIAGNOSIS — Z808 Family history of malignant neoplasm of other organs or systems: Secondary | ICD-10-CM

## 2022-04-27 DIAGNOSIS — Z833 Family history of diabetes mellitus: Secondary | ICD-10-CM

## 2022-04-27 DIAGNOSIS — D508 Other iron deficiency anemias: Secondary | ICD-10-CM

## 2022-04-27 DIAGNOSIS — D509 Iron deficiency anemia, unspecified: Principal | ICD-10-CM | POA: Diagnosis present

## 2022-04-27 DIAGNOSIS — E049 Nontoxic goiter, unspecified: Secondary | ICD-10-CM

## 2022-04-27 DIAGNOSIS — Z8249 Family history of ischemic heart disease and other diseases of the circulatory system: Secondary | ICD-10-CM

## 2022-04-27 DIAGNOSIS — E872 Acidosis, unspecified: Secondary | ICD-10-CM | POA: Diagnosis present

## 2022-04-27 DIAGNOSIS — Z803 Family history of malignant neoplasm of breast: Secondary | ICD-10-CM

## 2022-04-27 DIAGNOSIS — Z1231 Encounter for screening mammogram for malignant neoplasm of breast: Secondary | ICD-10-CM

## 2022-04-27 DIAGNOSIS — D649 Anemia, unspecified: Principal | ICD-10-CM

## 2022-04-27 DIAGNOSIS — Z800 Family history of malignant neoplasm of digestive organs: Secondary | ICD-10-CM

## 2022-04-27 DIAGNOSIS — Z6838 Body mass index (BMI) 38.0-38.9, adult: Secondary | ICD-10-CM

## 2022-04-27 DIAGNOSIS — E559 Vitamin D deficiency, unspecified: Secondary | ICD-10-CM | POA: Diagnosis present

## 2022-04-27 DIAGNOSIS — E669 Obesity, unspecified: Secondary | ICD-10-CM | POA: Diagnosis present

## 2022-04-27 DIAGNOSIS — E039 Hypothyroidism, unspecified: Secondary | ICD-10-CM | POA: Diagnosis present

## 2022-04-27 LAB — CBC WITH DIFFERENTIAL/PLATELET
Abs Immature Granulocytes: 0.02 10*3/uL (ref 0.00–0.07)
Basophils Absolute: 0 10*3/uL (ref 0.0–0.1)
Basophils Relative: 0 %
Eosinophils Absolute: 0.1 10*3/uL (ref 0.0–0.5)
Eosinophils Relative: 2 %
HCT: 20.7 % — ABNORMAL LOW (ref 36.0–46.0)
Hemoglobin: 5.1 g/dL — ABNORMAL LOW (ref 12.0–15.0)
Immature Granulocytes: 0 %
Lymphocytes Relative: 22 %
Lymphs Abs: 1.3 10*3/uL (ref 0.7–4.0)
MCH: 15.7 pg — ABNORMAL LOW (ref 26.0–34.0)
MCHC: 24.6 g/dL — ABNORMAL LOW (ref 30.0–36.0)
MCV: 63.7 fL — ABNORMAL LOW (ref 80.0–100.0)
Monocytes Absolute: 0.3 10*3/uL (ref 0.1–1.0)
Monocytes Relative: 5 %
Neutro Abs: 4 10*3/uL (ref 1.7–7.7)
Neutrophils Relative %: 71 %
Platelets: 287 10*3/uL (ref 150–400)
RBC: 3.25 MIL/uL — ABNORMAL LOW (ref 3.87–5.11)
RDW: 20.3 % — ABNORMAL HIGH (ref 11.5–15.5)
Smear Review: NORMAL
WBC: 5.6 10*3/uL (ref 4.0–10.5)
nRBC: 0 % (ref 0.0–0.2)

## 2022-04-27 LAB — COMPREHENSIVE METABOLIC PANEL
ALT: 9 U/L (ref 0–44)
AST: 16 U/L (ref 15–41)
Albumin: 4.1 g/dL (ref 3.5–5.0)
Alkaline Phosphatase: 83 U/L (ref 38–126)
Anion gap: 8 (ref 5–15)
BUN: 11 mg/dL (ref 6–20)
CO2: 21 mmol/L — ABNORMAL LOW (ref 22–32)
Calcium: 8.7 mg/dL — ABNORMAL LOW (ref 8.9–10.3)
Chloride: 107 mmol/L (ref 98–111)
Creatinine, Ser: 1.02 mg/dL — ABNORMAL HIGH (ref 0.44–1.00)
GFR, Estimated: >60 mL/min (ref >60)
Glucose, Bld: 119 mg/dL — ABNORMAL HIGH (ref 70–99)
Potassium: 3.7 mmol/L (ref 3.5–5.1)
Sodium: 136 mmol/L (ref 135–145)
Total Bilirubin: 0.6 mg/dL (ref 0.3–1.2)
Total Protein: 7.3 g/dL (ref 6.5–8.1)

## 2022-04-27 LAB — CBC
HCT: 21.5 % — ABNORMAL LOW (ref 36.0–46.0)
Hemoglobin: 5.2 g/dL — ABNORMAL LOW (ref 12.0–15.0)
MCH: 15.6 pg — ABNORMAL LOW (ref 26.0–34.0)
MCHC: 24.2 g/dL — ABNORMAL LOW (ref 30.0–36.0)
MCV: 64.6 fL — ABNORMAL LOW (ref 80.0–100.0)
Platelets: 294 10*3/uL (ref 150–400)
RBC: 3.33 MIL/uL — ABNORMAL LOW (ref 3.87–5.11)
RDW: 20.2 % — ABNORMAL HIGH (ref 11.5–15.5)
WBC: 5.7 10*3/uL (ref 4.0–10.5)
nRBC: 0 % (ref 0.0–0.2)

## 2022-04-27 LAB — TECHNOLOGIST SMEAR REVIEW: Plt Morphology: NORMAL

## 2022-04-27 LAB — PROTIME-INR
INR: 1.2 (ref 0.8–1.2)
Prothrombin Time: 15.5 seconds — ABNORMAL HIGH (ref 11.4–15.2)

## 2022-04-27 LAB — ABO/RH: ABO/RH(D): A NEG

## 2022-04-27 LAB — FERRITIN: Ferritin: 2 ng/mL — ABNORMAL LOW (ref 11–307)

## 2022-04-27 LAB — LACTATE DEHYDROGENASE: LDH: 101 U/L (ref 98–192)

## 2022-04-27 LAB — APTT: aPTT: 28 seconds (ref 24–36)

## 2022-04-27 LAB — PREPARE RBC (CROSSMATCH)

## 2022-04-27 LAB — IRON AND TIBC
Iron: 17 ug/dL — ABNORMAL LOW (ref 28–170)
Saturation Ratios: 4 % — ABNORMAL LOW (ref 10.4–31.8)
TIBC: 413 ug/dL (ref 250–450)
UIBC: 396 ug/dL

## 2022-04-27 LAB — HIV ANTIBODY (ROUTINE TESTING W REFLEX): HIV Screen 4th Generation wRfx: NONREACTIVE

## 2022-04-27 MED ORDER — FERROUS SULFATE 325 (65 FE) MG PO TABS
325.0000 mg | ORAL_TABLET | Freq: Two times a day (BID) | ORAL | Status: DC
  Administered 2022-04-28 – 2022-04-29 (×4): 325 mg via ORAL
  Filled 2022-04-27 (×4): qty 1

## 2022-04-27 MED ORDER — PANTOPRAZOLE SODIUM 40 MG PO TBEC
40.0000 mg | DELAYED_RELEASE_TABLET | Freq: Every day | ORAL | Status: DC
  Administered 2022-04-28 – 2022-04-29 (×2): 40 mg via ORAL
  Filled 2022-04-27 (×2): qty 1

## 2022-04-27 MED ORDER — SENNOSIDES-DOCUSATE SODIUM 8.6-50 MG PO TABS: 1.0000 | ORAL_TABLET | Freq: Every evening | ORAL | Status: DC | PRN

## 2022-04-27 MED ORDER — SODIUM CHLORIDE 0.9 % IV SOLN
10.0000 mL/h | Freq: Once | INTRAVENOUS | Status: AC
  Administered 2022-04-27: 10 mL/h via INTRAVENOUS

## 2022-04-27 MED ORDER — SODIUM CHLORIDE 0.9 % IV SOLN
200.0000 mg | Freq: Once | INTRAVENOUS | Status: AC
  Administered 2022-04-27: 200 mg via INTRAVENOUS
  Filled 2022-04-27: qty 200

## 2022-04-27 MED ORDER — SODIUM CHLORIDE 0.9 % IV BOLUS
1000.0000 mL | Freq: Once | INTRAVENOUS | Status: AC
  Administered 2022-04-27: 1000 mL via INTRAVENOUS

## 2022-04-27 NOTE — ED Triage Notes (Addendum)
Pt to ED via POV from [facility] office. [place] called to give this RN reports which included pt's HGB 5. Pt reports she is asymptomatic and was there for routine check up. Pt reports she is always and cold. Pt reports she doesn't feel any different than usual. Pt denies hx anemia or bleeds. Pt denies blood thinners. Pt does appear pale.

## 2022-04-27 NOTE — ED Provider Notes (Signed)
[facility] Provider Note    Event Date/Time   First MD Initiated Contact with Patient 04/27/22 1438     (approximate)   History   Abnormal Lab   HPI  Briana Thompson is a 47 y.o. female presents to the emergency department with anemia.  Patient was evaluated for first-time visit today [facility] clinic.  Told to come to the emergency department because her hemoglobin level was 5.2.  States that she is asymptomatic.  Is always cold.  History of anemia in the past but denies any history of a blood transfusion.  Denies any melanotic stool.  Last menstrual period was February 2.  Denies any significantly heavy menstrual cycles.  Denies concern for pregnancy.     Physical Exam   Triage Vital Signs: ED Triage Vitals  Enc Vitals Group     BP 04/27/22 1430 (!) 143/79     Pulse Rate 04/27/22 1430 96     Resp 04/27/22 1430 18     Temp 04/27/22 1430 97.7 F (36.5 C)     Temp Source 04/27/22 1430 Oral     SpO2 04/27/22 1430 100 %     Weight --      Height --      Head Circumference --      Peak Flow --      Pain Score 04/27/22 1431 0     Pain Loc --      Pain Edu? --      Excl. in [place]? --     Most recent vital signs: Vitals:   04/27/22 1430  BP: (!) 143/79  Pulse: 96  Resp: 18  Temp: 97.7 F (36.5 C)  SpO2: 100%    Physical Exam Constitutional:      Appearance: She is well-developed.  HENT:     Head: Atraumatic.  Eyes:     Comments: Pale conjunctiva  Cardiovascular:     Rate and Rhythm: Regular rhythm.  Pulmonary:     Effort: No respiratory distress.  Abdominal:     General: There is no distension.  Musculoskeletal:        General: Normal range of motion.     Cervical back: Normal range of motion.  Skin:    General: Skin is warm.  Neurological:     Mental Status: She is alert. Mental status is at baseline.   Differ  IMPRESSION / MDM / ASSESSMENT AND PLAN / ED COURSE  I reviewed the triage vital signs and the nursing  notes.  Differential diagnosis including anemia, iron deficiency, B12 deficiency, folate deficiency, no obvious signs or symptoms of a GI bleed  EKG   No tachycardic or bradycardic dysrhythmias while on cardiac telemetry. LABS (all labs ordered are listed, but only abnormal results are displayed) Labs interpreted as -    Labs Reviewed  COMPREHENSIVE METABOLIC PANEL - Abnormal; Notable for the following components:      Result Value   CO2 21 (*)    Glucose, Bld 119 (*)    Creatinine, Ser 1.02 (*)    Calcium 8.7 (*)    All other components within normal limits  CBC - Abnormal; Notable for the following components:   RBC 3.33 (*)    Hemoglobin 5.2 (*)    HCT 21.5 (*)    MCV 64.6 (*)    MCH 15.6 (*)    MCHC 24.2 (*)    RDW 20.2 (*)    All other components within normal limits  IRON  AND TIBC  FERRITIN  PREGNANCY, URINE  POC OCCULT BLOOD, ED  TYPE AND SCREEN  PREPARE RBC (CROSSMATCH)    TREATMENT  2 units PRBC  MDM  Hemoglobin 5.2.  Patient was typed and crossed, ordered 2 units PRBC.  Consulted hospitalist for admission.  Admitted for anemia with hemoglobin less than 7 needing blood transfusion.     PROCEDURES:  Critical Care performed: No  .Critical Care  Performed by: Nathaniel Man, MD Authorized by: Nathaniel Man, MD   Critical care provider statement:    Critical care time (minutes):  30   Critical care time was exclusive of:  Separately billable procedures and treating other patients   Critical care was necessary to treat or prevent imminent or life-threatening deterioration of the following conditions:  Circulatory failure   Critical care was time spent personally by me on the following activities:  Development of treatment plan with patient or surrogate, discussions with consultants, evaluation of patient's response to treatment, examination of patient, ordering and review of laboratory studies, ordering and review of radiographic studies, ordering and  performing treatments and interventions, pulse oximetry, re-evaluation of patient's condition and review of old charts   Patient's presentation is most consistent with acute presentation with potential threat to life or bodily function.   MEDICATIONS ORDERED IN ED: Medications  0.9 %  sodium chloride infusion (has no administration in time range)  sodium chloride 0.9 % bolus 1,000 mL (has no administration in time range)    FINAL CLINICAL IMPRESSION(S) / ED DIAGNOSES   Final diagnoses:  Anemia, unspecified type     Rx / DC Orders   ED Discharge Orders     None        Note:  This document was prepared using Dragon voice recognition software and may include unintentional dictation errors.   Nathaniel Man, MD 04/27/22 [phone]

## 2022-04-27 NOTE — H&P (Addendum)
History and Physical    Briana Thompson [id number] DOB: 09/29/1975 DOA: 04/27/2022  Referring MD/NP/PA:   PCP: Linda Hedges, CNM   Patient coming from:  The patient is coming from home.  At baseline, pt is independent for most of ADL.        Chief Complaint: Low hemoglobin  HPI: Briana Thompson is a 47 y.o. female with medical history significant of enlarged thyroid (patient denies history of hypothyroidism), who presents with low hemoglobin.  Pt states that she was seen as first-time visit in [place] clinic and found to have low hemoglobin 5.2. She was sent to ED for further evaluation and treatment.  Patient states that she has very mild weakness sometimes, not today.  Denies lightheadedness, dizziness, chest pain, shortness breath.  She is taking ibuprofen, but denies rectal bleeding or dark stool.  She states that her menstrual period has been regular and not heavy.  Last menstrual was on 04/03/2022.  Data reviewed independently and ED Course: pt was found to have hemoglobin 5.2, WBC 5.  7, GFR> 60, temperature normal, blood pressure 143/79, heart rate 96, RR 18.  Patient is placed on MedSurg bed for observation.   EKG: Not done in ED, will get one.    Review of Systems:   General: no fevers, chills, no body weight gain, fatigue HEENT: no blurry vision, hearing changes or sore throat Respiratory: no dyspnea, coughing, wheezing CV: no chest pain, no palpitations GI: no nausea, vomiting, abdominal pain, diarrhea, constipation GU: no dysuria, burning on urination, increased urinary frequency, hematuria  Ext: no leg edema Neuro: no unilateral weakness, numbness, or tingling, no vision change or hearing loss Skin: no rash, no skin tear. MSK: No muscle spasm, no deformity, no limitation of range of movement in spin Heme: No easy bruising.  Travel history: No recent long distant travel.   Allergy: No Known Allergies  Past Medical History:  Diagnosis Date   BRCA negative  2013   BRCA/BART neg   Enlarged thyroid    Family history of breast cancer 06/2011   Pt is BRCA/BART neg. IBIS=25.85% QYR mammogram/6 mo breast check MRI QYR; Mat aunt BRCA neg   Hypothyroidism    Hypothyroidism    Increased risk of breast cancer 2013   IBIS-25%   Vitamin D deficiency     Past Surgical History:  Procedure Laterality Date   TUBAL LIGATION  01/2007    Social History:  reports that she has never smoked. She has never used smokeless tobacco. She reports current alcohol use. She reports that she does not use drugs.  Family History:  Family History  Problem Relation Age of Onset   Breast cancer Mother 13   Brain cancer Mother 43   Breast cancer Maternal Aunt 54   Breast cancer Maternal Grandmother 60   Hypertension Father    Diabetes Father    Pancreatic cancer Maternal Uncle      Prior to Admission medications   Medication Sig Start Date End Date Taking? Authorizing Provider  benzonatate (TESSALON) 100 MG capsule Take 1 capsule (100 mg total) by mouth 3 (three) times daily as needed. 04/01/18   Coral Spikes, DO  chlorpheniramine-HYDROcodone (TUSSIONEX PENNKINETIC ER) 10-8 MG/5ML SUER Take 5 mLs by mouth at bedtime as needed. 04/01/18   Coral Spikes, DO  ibuprofen (ADVIL,MOTRIN) 200 MG tablet Take by mouth every 6 (six) hours as needed.     [provider]  ipratropium (ATROVENT) 0.06 % nasal spray Place 2  sprays into both nostrils 4 (four) times daily as needed for rhinitis. 04/01/18   Coral Spikes, DO    Physical Exam: Vitals:   04/27/22 1430  BP: (!) 143/79  Pulse: 96  Resp: 18  Temp: 97.7 F (36.5 C)  TempSrc: Oral  SpO2: 100%   General: Not in acute distress. Pale looking. HEENT:       Eyes: PERRL, EOMI, no scleral icterus.       ENT: No discharge from the ears and nose, no pharynx injection, no tonsillar enlargement.        Neck: No JVD, no bruit, no mass felt. Heme: No neck lymph node enlargement. Cardiac: S1/S2, RRR, No murmurs, No  gallops or rubs. Respiratory: No rales, wheezing, rhonchi or rubs. GI: Soft, nondistended, nontender, no rebound pain, no organomegaly, BS present. GU: No hematuria Ext: No pitting leg edema bilaterally. 1+DP/PT pulse bilaterally. Musculoskeletal: No joint deformities, No joint redness or warmth, no limitation of ROM in spin. Skin: No rashes.  Neuro: Alert, oriented X3, cranial nerves II-XII grossly intact, moves all extremities normally.  Psych: Patient is not psychotic, no suicidal or hemocidal ideation.  Labs on Admission: I have personally reviewed following labs and imaging studies  CBC: Recent Labs  Lab 04/27/22 1433  WBC 5.7  HGB 5.2*  HCT 21.5*  MCV 64.6*  PLT [id number]   Basic Metabolic Panel: Recent Labs  Lab 04/27/22 1433  NA 136  K 3.7  CL 107  CO2 21*  GLUCOSE 119*  BUN 11  CREATININE 1.02*  CALCIUM 8.7*   GFR: CrCl cannot be calculated (Unknown ideal weight.). Liver Function Tests: Recent Labs  Lab 04/27/22 1433  AST 16  ALT 9  ALKPHOS 83  BILITOT 0.6  PROT 7.3  ALBUMIN 4.1   No results for input(s): "LIPASE", "AMYLASE" in the last 168 hours. No results for input(s): "AMMONIA" in the last 168 hours. Coagulation Profile: Recent Labs  Lab 04/27/22 1718  INR 1.2   Cardiac Enzymes: No results for input(s): "CKTOTAL", "CKMB", "CKMBINDEX", "TROPONINI" in the last 168 hours. BNP (last 3 results) No results for input(s): "PROBNP" in the last 8760 hours. HbA1C: No results for input(s): "HGBA1C" in the last 72 hours. CBG: No results for input(s): "GLUCAP" in the last 168 hours. Lipid Profile: No results for input(s): "CHOL", "HDL", "LDLCALC", "TRIG", "CHOLHDL", "LDLDIRECT" in the last 72 hours. Thyroid Function Tests: No results for input(s): "TSH", "T4TOTAL", "FREET4", "T3FREE", "THYROIDAB" in the last 72 hours. Anemia Panel: Recent Labs    04/27/22 1433  FERRITIN 2*  TIBC 413  IRON 17*   Urine analysis: No results found for: "COLORURINE",  "APPEARANCEUR", "LABSPEC", "PHURINE", "GLUCOSEU", "HGBUR", "BILIRUBINUR", "KETONESUR", "PROTEINUR", "UROBILINOGEN", "NITRITE", "LEUKOCYTESUR" Sepsis Labs: '@LABRCNTIP'$ (procalcitonin:4,lacticidven:4) )No results found for this or any previous visit (from the past 240 hour(s)).   Radiological Exams on Admission: No results found.    Assessment/Plan Principal Problem:   Microcytic anemia Active Problems:   Enlarged thyroid   Iron deficiency anemia   Assessment and Plan:   Microcytic anemia: Hemoglobin 5.2, MCV 64.  Etiology is not clear.  No heavy menstrual period.  Patient is taking ibuprofen, but denies dark stool or rectal bleeding.  Suspect chronic GI blood loss.  Her liver function is normal.  Patient does not have jaundice.  Low suspicions for hemolytic anemia.  No history of  gastric bypass surgery.  I discussed with patient about further evaluation.  Since I suspect patient may have chronic GI blood loss, I recommended  for her to follow-up with GI.  She stated that she can get GI referral from her PCP without any difficulty.  She probably will be able to get appointment with GI in about 2 weeks.  -Placed on MedSurg bed for patient -Check anemia panel -Transfused 2 unit of blood -Check LDH and peripheral smear -Get differential -Follow-up with GI  Addendum: Iron deficiency anemia: Anemia panel finding is consistent with iron deficiency with iron 17, ferritin 2. -Start ferrous sulfate 325 mg twice daily -As needed senna code -Give 1 dose of IV Venofer 200 mg  History of enlarged thyroid: Patient denies history of hypothyroidism. -Follow-up with GI    DVT ppx: SCD  Code Status: Full code  Family Communication: not done, no family member is at bed side.   Disposition Plan:  Anticipate discharge back to previous environment  Consults called:  none  Admission status and Level of care: Med-Surg:   for obs   Dispo: The patient is from: Home              Anticipated d/c  is to: Home              Anticipated d/c date is: 1 day              Patient currently is not medically stable to d/c.    Severity of Illness:  The appropriate patient status for this patient is OBSERVATION. Observation status is judged to be reasonable and necessary in order to provide the required intensity of service to ensure the patient's safety. The patient's presenting symptoms, physical exam findings, and initial radiographic and laboratory data in the context of their medical condition is felt to place them at decreased risk for further clinical deterioration. Furthermore, it is anticipated that the patient will be medically stable for discharge from the hospital within 2 midnights of admission.        Date of Service 04/27/2022    Ivor Costa [facility]   If 7PM-7AM, please contact night-coverage [email or web address] 04/27/2022, 7:17 PM

## 2022-04-28 DIAGNOSIS — E039 Hypothyroidism, unspecified: Secondary | ICD-10-CM

## 2022-04-28 DIAGNOSIS — E669 Obesity, unspecified: Secondary | ICD-10-CM | POA: Insufficient documentation

## 2022-04-28 DIAGNOSIS — E872 Acidosis, unspecified: Secondary | ICD-10-CM | POA: Insufficient documentation

## 2022-04-28 DIAGNOSIS — D508 Other iron deficiency anemias: Secondary | ICD-10-CM

## 2022-04-28 LAB — HEPATIC FUNCTION PANEL
ALT: 9 U/L (ref 0–44)
AST: 18 U/L (ref 15–41)
Albumin: 3.6 g/dL (ref 3.5–5.0)
Alkaline Phosphatase: 73 U/L (ref 38–126)
Bilirubin, Direct: 0.2 mg/dL (ref 0.0–0.2)
Indirect Bilirubin: 1 mg/dL — ABNORMAL HIGH (ref 0.3–0.9)
Total Bilirubin: 1.2 mg/dL (ref 0.3–1.2)
Total Protein: 6.8 g/dL (ref 6.5–8.1)

## 2022-04-28 LAB — RETIC PANEL
Immature Retic Fract: 29 % — ABNORMAL HIGH (ref 2.3–15.9)
RBC.: 3.93 MIL/uL (ref 3.87–5.11)
Retic Count, Absolute: 47.9 10*3/uL (ref 19.0–186.0)
Retic Ct Pct: 1.2 % (ref 0.4–3.1)
Reticulocyte Hemoglobin: 15.1 pg — ABNORMAL LOW (ref >27.9)

## 2022-04-28 LAB — CBC
HCT: 22.5 % — ABNORMAL LOW (ref 36.0–46.0)
Hemoglobin: 6.3 g/dL — ABNORMAL LOW (ref 12.0–15.0)
MCH: 18.9 pg — ABNORMAL LOW (ref 26.0–34.0)
MCHC: 28 g/dL — ABNORMAL LOW (ref 30.0–36.0)
MCV: 67.4 fL — ABNORMAL LOW (ref 80.0–100.0)
Platelets: 222 10*3/uL (ref 150–400)
RBC: 3.34 MIL/uL — ABNORMAL LOW (ref 3.87–5.11)
RDW: 22.1 % — ABNORMAL HIGH (ref 11.5–15.5)
WBC: 6.3 10*3/uL (ref 4.0–10.5)
nRBC: 0 % (ref 0.0–0.2)

## 2022-04-28 LAB — BASIC METABOLIC PANEL
Anion gap: 10 (ref 5–15)
BUN: 8 mg/dL (ref 6–20)
CO2: 19 mmol/L — ABNORMAL LOW (ref 22–32)
Calcium: 8.9 mg/dL (ref 8.9–10.3)
Chloride: 107 mmol/L (ref 98–111)
Creatinine, Ser: 0.87 mg/dL (ref 0.44–1.00)
GFR, Estimated: >60 mL/min (ref >60)
Glucose, Bld: 124 mg/dL — ABNORMAL HIGH (ref 70–99)
Potassium: 3.7 mmol/L (ref 3.5–5.1)
Sodium: 136 mmol/L (ref 135–145)

## 2022-04-28 LAB — VITAMIN B12: Vitamin B-12: 308 pg/mL (ref 180–914)

## 2022-04-28 LAB — PREGNANCY, URINE: Preg Test, Ur: NEGATIVE

## 2022-04-28 LAB — TSH: TSH: 6.617 u[IU]/mL — ABNORMAL HIGH (ref 0.350–4.500)

## 2022-04-28 LAB — TYPE AND SCREEN

## 2022-04-28 LAB — PREPARE RBC (CROSSMATCH)

## 2022-04-28 LAB — OCCULT BLOOD X 1 CARD TO LAB, STOOL: Fecal Occult Bld: NEGATIVE

## 2022-04-28 LAB — HEMOGLOBIN: Hemoglobin: 7.6 g/dL — ABNORMAL LOW (ref 12.0–15.0)

## 2022-04-28 LAB — FOLATE: Folate: 14.2 ng/mL (ref >5.9)

## 2022-04-28 MED ORDER — LEVOTHYROXINE SODIUM 25 MCG PO TABS
25.0000 ug | ORAL_TABLET | Freq: Every day | ORAL | Status: DC
  Administered 2022-04-29: 25 ug via ORAL
  Filled 2022-04-28: qty 1

## 2022-04-28 MED ORDER — SODIUM CHLORIDE 0.9% IV SOLUTION: Freq: Once | INTRAVENOUS | Status: AC

## 2022-04-28 MED ORDER — SODIUM BICARBONATE 650 MG PO TABS
650.0000 mg | ORAL_TABLET | Freq: Three times a day (TID) | ORAL | Status: DC
  Administered 2022-04-28 – 2022-04-29 (×3): 650 mg via ORAL
  Filled 2022-04-28 (×3): qty 1

## 2022-04-28 MED ORDER — SODIUM CHLORIDE 0.9 % IV SOLN
300.0000 mg | Freq: Once | INTRAVENOUS | Status: AC
  Administered 2022-04-28: 300 mg via INTRAVENOUS
  Filled 2022-04-28: qty 300

## 2022-04-28 NOTE — Consult Note (Signed)
Hematology/Oncology Consult note [phone] [phone]      Patient Care Team: Linda Hedges, CNM as PCP - General (Certified Nurse Midwife)   Name of the patient: Briana Thompson  [id number]  09-14-1975   REASON FOR COSULTATION:  Iron deficiency anemia History of presenting illness-  47 y.o. female with PMH listed at below who presents to ER for evaluation of low hemoglobin.  Patient had outpatient blood work done at [facility] and was found to have a hemoglobin of 5.2.  Patient was sent to emergency room for further evaluation. + Fatigue denies chest pain, shortness of breath rectal bleeding, dark stool, abdominal pain, acid reflux unintentional weight loss. Patient reports that her menstrual period has been regular and not very heavy. She takes over-the-counter ibuprofen occasionally.  Patient was admitted for anemia workup and treatments.  04/27/2022 iron panel showed iron saturation 4%, ferritin 2.  Patient received total of 3 units of PRBC transfusion since admission, status post IV Venofer 200 mg x1 on 04/27/2022, and status post Venofer 300 mg x 1 1 04/28/2022. Today hemoglobin has improved to 7.6. Normal PTT, slightly increased PT, folate normal, vitamin B12 308.  No Known Allergies  Patient Active Problem List   Diagnosis Date Noted   Obesity (BMI 35.0-39.9 without comorbidity) 123XX123   Metabolic acidosis 123XX123   Microcytic anemia 04/27/2022   Enlarged thyroid 04/27/2022   Iron deficiency anemia 04/27/2022   Hypothyroidism    Family history of breast cancer 06/01/2011   Increased risk of breast cancer 03/03/2011   BRCA negative 03/03/2011     Past Medical History:  Diagnosis Date   BRCA negative 2013   BRCA/BART neg   Enlarged thyroid    Family history of breast cancer 06/2011   Pt is BRCA/BART neg. IBIS=25.85% QYR mammogram/6 mo breast check MRI QYR; Mat aunt BRCA neg   Hypothyroidism    Hypothyroidism    Increased risk  of breast cancer 2013   IBIS-25%   Vitamin D deficiency      Past Surgical History:  Procedure Laterality Date   TUBAL LIGATION  01/2007    Social History   Socioeconomic History   Marital status: Married    Spouse name: Not on file   Number of children: Not on file   Years of education: Not on file   Highest education level: Not on file  Occupational History   Not on file  Tobacco Use   Smoking status: Never   Smokeless tobacco: Never  Vaping Use   Vaping Use: Never used  Substance and Sexual Activity   Alcohol use: Yes    Comment: rare   Drug use: Never   Sexual activity: Yes    Birth control/protection: Surgical    Comment: tubal   Other Topics Concern   Not on file  Social History Narrative   Not on file   Social Determinants of Health   Financial Resource Strain: Not on file  Food Insecurity: No Food Insecurity (04/27/2022)   Hunger Vital Sign    Worried About Running Out of Food in the Last Year: Never true    Ran Out of Food in the Last Year: Never true  Transportation Needs: No Transportation Needs (04/27/2022)   PRAPARE - Transportation    Lack of Transportation (Medical): No    Lack of Transportation (Non-Medical): No  Physical Activity: Not on file  Stress: Not on file  Social Connections: Not on file  Intimate Partner Violence: Not At Risk (04/27/2022)  Humiliation, Afraid, Rape, and Kick questionnaire    Fear of Current or Ex-Partner: No    Emotionally Abused: No    Physically Abused: No    Sexually Abused: No     Family History  Problem Relation Age of Onset   Breast cancer Mother 40   Brain cancer Mother 68   Breast cancer Maternal Aunt 42   Breast cancer Maternal Grandmother 43   Hypertension Father    Diabetes Father    Pancreatic cancer Maternal Uncle      Current Facility-Administered Medications:    ferrous sulfate tablet 325 mg, 325 mg, Oral, BID WC, Ivor Costa, MD, 325 mg at 04/28/22 0918   [START ON 04/29/2022]  levothyroxine (SYNTHROID) tablet 25 mcg, 25 mcg, Oral, Q0600, Sharen Hones, MD   pantoprazole (PROTONIX) EC tablet 40 mg, 40 mg, Oral, Q1200, Ivor Costa, MD, 40 mg at 04/28/22 1232   senna-docusate (Senokot-S) tablet 1 tablet, 1 tablet, Oral, QHS PRN, Ivor Costa, MD   sodium bicarbonate tablet 650 mg, 650 mg, Oral, TID, Sharen Hones, MD  Review of Systems  Constitutional:  Positive for fatigue. Negative for appetite change, chills and fever.  HENT:   Negative for hearing loss and voice change.   Eyes:  Negative for eye problems.  Respiratory:  Negative for chest tightness and cough.   Cardiovascular:  Negative for chest pain.  Gastrointestinal:  Negative for abdominal distention, abdominal pain, blood in stool and nausea.  Endocrine: Negative for hot flashes.  Genitourinary:  Negative for difficulty urinating and frequency.   Musculoskeletal:  Negative for arthralgias.  Skin:  Negative for itching and rash.  Neurological:  Negative for extremity weakness.  Hematological:  Negative for adenopathy.  Psychiatric/Behavioral:  Negative for confusion.     PHYSICAL EXAM Vitals:   04/28/22 0921 04/28/22 0946 04/28/22 1215 04/28/22 1230  BP: (!) 138/52 130/61  (!) 143/69  Pulse: 82 88  82  Resp: '20 20  20  '$ Temp: 98 F (36.7 C) 98.1 F (36.7 C)  98.1 F (36.7 C)  TempSrc: Oral Oral  Oral  SpO2: 98% 100%  99%  Weight:   257 lb 15 oz (117 kg)   Height:   '5\' 9"'$  (1.753 m)    Physical Exam Constitutional:      General: She is not in acute distress.    Appearance: She is not diaphoretic.  HENT:     Head: Normocephalic and atraumatic.     Nose: Nose normal.     Mouth/Throat:     Pharynx: No oropharyngeal exudate.  Eyes:     General: No scleral icterus.    Pupils: Pupils are equal, round, and reactive to light.  Cardiovascular:     Rate and Rhythm: Normal rate.     Heart sounds: No murmur heard. Pulmonary:     Effort: Pulmonary effort is normal. No respiratory distress.     Breath  sounds: No wheezing.  Abdominal:     General: There is no distension.     Palpations: Abdomen is soft.     Tenderness: There is no abdominal tenderness.  Musculoskeletal:        General: Normal range of motion.     Cervical back: Normal range of motion and neck supple.  Skin:    General: Skin is warm and dry.     Coloration: Skin is pale.     Findings: No erythema.  Neurological:     Mental Status: She is alert and oriented to person, place,  and time. Mental status is at baseline.     Cranial Nerves: No cranial nerve deficit.     Motor: No abnormal muscle tone.     Coordination: Coordination normal.  Psychiatric:        Mood and Affect: Mood and affect normal.       LABORATORY STUDIES    Latest Ref Rng & Units 04/28/2022    1:39 PM 04/28/2022    4:54 AM 04/27/2022    5:22 PM  CBC  WBC 4.0 - 10.5 K/uL  6.3  5.6   Hemoglobin 12.0 - 15.0 g/dL 7.6  6.3  5.1   Hematocrit 36.0 - 46.0 %  22.5  20.7   Platelets 150 - 400 K/uL  222  287       Latest Ref Rng & Units 04/28/2022    1:39 PM 04/27/2022    2:33 PM  CMP  Glucose 70 - 99 mg/dL 124  119   BUN 6 - 20 mg/dL 8  11   Creatinine 0.44 - 1.00 mg/dL 0.87  1.02   Sodium 135 - 145 mmol/L 136  136   Potassium 3.5 - 5.1 mmol/L 3.7  3.7   Chloride 98 - 111 mmol/L 107  107   CO2 22 - 32 mmol/L 19  21   Calcium 8.9 - 10.3 mg/dL 8.9  8.7   Total Protein 6.5 - 8.1 g/dL 6.8  7.3   Total Bilirubin 0.3 - 1.2 mg/dL 1.2  0.6   Alkaline Phos 38 - 126 U/L 73  83   AST 15 - 41 U/L 18  16   ALT 0 - 44 U/L 9  9      RADIOGRAPHIC STUDIES: I have personally reviewed the radiological images as listed and agreed with the findings in the report. No results found.   Assessment and plan-   # Severe iron deficiency anemia Status post PRBC transfusions and IV Venofer treatments. Hemoglobin has improved to 7.6.  Continue monitor CBC closely.  Reticulocyte panel showed decreased reticulocyte hemoglobin, inappropriately normal reticulocyte  percentage. Recommend patient to follow-up with hematology clinic outpatient with repeat CBC and additional Venofer treatments. Etiology of iron deficiency is unclear.  Blood loss with GI origin versus menses, or abnormal absorption deficiency.  Check celiac panel. Recommend GI evaluation.  # Hypothyroidism, TSH is elevated.  Recommend continue levothyroxine, outpatient follow-up with primary care provider.  Thank you for allowing me to participate in the care of this patient.   [name], MD, PhD Hematology Oncology 04/28/2022

## 2022-04-28 NOTE — Progress Notes (Signed)
OT Cancellation Note  Patient Details Name: NERISSA STARR MRN: [id number] DOB: Apr 23, 1975   Cancelled Treatment:    Reason Eval/Treat Not Completed: OT screened, no needs identified, will sign off. Pt reports being Ind currently in room. RN confirms. Pt does not need skilled OT intervention at this time. OT to complete orders.   Darleen Crocker, MS, OTR/L , CBIS ascom [phone]  04/28/22, 2:18 PM

## 2022-04-28 NOTE — Progress Notes (Addendum)
  Progress Note   Patient: Briana Thompson DOB: [birth year] DOA: 04/27/2022     0 DOS: the patient was seen and examined on 04/28/2022   Brief hospital course: Briana Thompson is a 47 y.o. female with medical history significant of enlarged thyroid (patient denies history of hypothyroidism), who presents with low hemoglobin of 5.1. She received 3 units of PRBC, she also received a 2 dose of IV iron.  Hemoglobin came back to 7.6.    Principal Problem:   Microcytic anemia Active Problems:   Hypothyroidism   Enlarged thyroid   Iron deficiency anemia   Obesity (BMI 35.0-39.9 without comorbidity)   Metabolic acidosis   Assessment and Plan: Severe iron deficient anemia. Etiology still unclear, she had no history of GI bleed, stool occult blood negative.  She does not have a large menstrual period.  She eats meat regularly.  Possibility of malabsorption.  Folic acid level was normal, B12 borderline, check homocystine level Patient has received 3 units PRBC and 2 doses of IV iron.  Hemoglobin seem to be better at 7.6.  Patient reticular count is low, the fraction of immature reticulocyte count is higher. Patient will be seen by hematology today.  Plan to be followed with GI as outpatient for colonoscopy and EGD.  Hypothyroidism. Patient has a mild elevation in TSH, will start lower dose Synthroid.  Non anion gap metabolic acidosis. Patient does not have any nausea vomiting or diarrhea. Patient is not taking any herbal supplements.  Unclear etiology. I will start sodium bicarb and orally.  Recheck level tomorrow.  Elevated PT PT slightly elevated with INR 1.2. Liver panel normal. Checked acute hepatitis panel, repeat PT/INR in am. Vitamin k level pending.  Obviously with BMI of 38. Diet and exercise.    Subjective:  Patient feels well today, no additional complaints.  No rectal bleeding no nausea vomiting or diarrhea.  Physical Exam: Vitals:   04/28/22 0921 04/28/22  0946 04/28/22 1215 04/28/22 1230  BP: (!) 138/52 130/61  (!) 143/69  Pulse: 82 88  82  Resp: 20 20  20  $ Temp: 98 F (36.7 C) 98.1 F (36.7 C)  98.1 F (36.7 C)  TempSrc: Oral Oral  Oral  SpO2: 98% 100%  99%  Weight:   117 kg   Height:   5' 9"$  (1.753 m)    General exam: Appears calm and comfortable  Respiratory system: Clear to auscultation. Respiratory effort normal. Cardiovascular system: S1 & S2 heard, RRR. No JVD, murmurs, rubs, gallops or clicks. No pedal edema. Gastrointestinal system: Abdomen is nondistended, soft and nontender. No organomegaly or masses felt. Normal bowel sounds heard. Central nervous system: Alert and oriented. No focal neurological deficits. Extremities: Symmetric 5 x 5 power. Skin: No rashes, lesions or ulcers Psychiatry: Judgement and insight appear normal. Mood & affect appropriate.    Data Reviewed:  Lab results reviewed.  Family Communication: Sister updated at bedside.  Disposition: Status is: Observation      Time spent: 35 minutes  Author: Sharen Hones, MD 04/28/2022 3:19 PM  For on call review [email or web address].

## 2022-04-28 NOTE — TOC CM/SW Note (Signed)
  Transition of Care [facility]) Screening Note   Patient Details  Name: Briana Thompson Date of Birth: [birth year]   Transition of Care [facility]) CM/SW Contact:    Gerilyn Pilgrim, LCSW Phone Number: 04/28/2022, 8:30 AM    Transition of Care Department [facility]) has reviewed patient and no TOC needs have been identified at this time. We will continue to monitor patient advancement through interdisciplinary progression rounds. If new patient transition needs arise, please place a TOC consult.

## 2022-04-28 NOTE — Hospital Course (Addendum)
Briana Thompson is a 47 y.o. female with medical history significant of enlarged thyroid (patient denies history of hypothyroidism), who presents with low hemoglobin of 5.1. She received 3 units of PRBC, she also received a 2 dose of IV iron.  Hemoglobin came back to 7.6.

## 2022-04-28 NOTE — Progress Notes (Signed)
PT Cancellation Note  Patient Details Name: Briana Thompson MRN: [id number] DOB: [birth year]   Cancelled Treatment:    Reason Eval/Treat Not Completed: Other (comment). Orders received and chart reviewed. Upon entry to room pt declining acute PT needs stating she is independent with mobility. OT updated. PT to sign off.    Salem Caster. Fairly IV, PT, DPT Physical Therapist- [facility]  04/28/2022, 2:18 PM

## 2022-04-29 DIAGNOSIS — E669 Obesity, unspecified: Secondary | ICD-10-CM | POA: Diagnosis present

## 2022-04-29 DIAGNOSIS — E039 Hypothyroidism, unspecified: Secondary | ICD-10-CM | POA: Diagnosis present

## 2022-04-29 DIAGNOSIS — Z800 Family history of malignant neoplasm of digestive organs: Secondary | ICD-10-CM | POA: Diagnosis not present

## 2022-04-29 DIAGNOSIS — E559 Vitamin D deficiency, unspecified: Secondary | ICD-10-CM | POA: Diagnosis present

## 2022-04-29 DIAGNOSIS — Z6838 Body mass index (BMI) 38.0-38.9, adult: Secondary | ICD-10-CM | POA: Diagnosis not present

## 2022-04-29 DIAGNOSIS — E872 Acidosis, unspecified: Secondary | ICD-10-CM | POA: Diagnosis present

## 2022-04-29 DIAGNOSIS — E049 Nontoxic goiter, unspecified: Secondary | ICD-10-CM | POA: Diagnosis present

## 2022-04-29 DIAGNOSIS — Z803 Family history of malignant neoplasm of breast: Secondary | ICD-10-CM | POA: Diagnosis not present

## 2022-04-29 DIAGNOSIS — Z808 Family history of malignant neoplasm of other organs or systems: Secondary | ICD-10-CM | POA: Diagnosis not present

## 2022-04-29 DIAGNOSIS — D509 Iron deficiency anemia, unspecified: Secondary | ICD-10-CM | POA: Diagnosis present

## 2022-04-29 DIAGNOSIS — Z8249 Family history of ischemic heart disease and other diseases of the circulatory system: Secondary | ICD-10-CM | POA: Diagnosis not present

## 2022-04-29 DIAGNOSIS — Z833 Family history of diabetes mellitus: Secondary | ICD-10-CM | POA: Diagnosis not present

## 2022-04-29 LAB — CBC
HCT: 27.5 % — ABNORMAL LOW (ref 36.0–46.0)
Hemoglobin: 7.9 g/dL — ABNORMAL LOW (ref 12.0–15.0)
MCH: 19.9 pg — ABNORMAL LOW (ref 26.0–34.0)
MCHC: 28.7 g/dL — ABNORMAL LOW (ref 30.0–36.0)
MCV: 69.4 fL — ABNORMAL LOW (ref 80.0–100.0)
Platelets: 271 10*3/uL (ref 150–400)
RBC: 3.96 MIL/uL (ref 3.87–5.11)
RDW: 24 % — ABNORMAL HIGH (ref 11.5–15.5)
WBC: 6.8 10*3/uL (ref 4.0–10.5)
nRBC: 0.3 % — ABNORMAL HIGH (ref 0.0–0.2)

## 2022-04-29 LAB — URINALYSIS, COMPLETE (UACMP) WITH MICROSCOPIC
Bacteria, UA: NONE SEEN
Bilirubin Urine: NEGATIVE
Glucose, UA: NEGATIVE mg/dL
Hgb urine dipstick: NEGATIVE
Ketones, ur: NEGATIVE mg/dL
Nitrite: NEGATIVE
Protein, ur: NEGATIVE mg/dL
Specific Gravity, Urine: 1.004 — ABNORMAL LOW (ref 1.005–1.030)
pH: 6 (ref 5.0–8.0)

## 2022-04-29 LAB — MAGNESIUM: Magnesium: 2.3 mg/dL (ref 1.7–2.4)

## 2022-04-29 LAB — HEPATITIS PANEL, ACUTE
HCV Ab: NONREACTIVE
Hep A IgM: NONREACTIVE
Hep B C IgM: NONREACTIVE
Hepatitis B Surface Ag: NONREACTIVE

## 2022-04-29 LAB — BASIC METABOLIC PANEL
Anion gap: 8 (ref 5–15)
BUN: 9 mg/dL (ref 6–20)
CO2: 20 mmol/L — ABNORMAL LOW (ref 22–32)
Calcium: 8.7 mg/dL — ABNORMAL LOW (ref 8.9–10.3)
Chloride: 108 mmol/L (ref 98–111)
Creatinine, Ser: 0.79 mg/dL (ref 0.44–1.00)
GFR, Estimated: >60 mL/min (ref >60)
Glucose, Bld: 104 mg/dL — ABNORMAL HIGH (ref 70–99)
Potassium: 3.6 mmol/L (ref 3.5–5.1)
Sodium: 136 mmol/L (ref 135–145)

## 2022-04-29 LAB — PHOSPHORUS: Phosphorus: 4 mg/dL (ref 2.5–4.6)

## 2022-04-29 LAB — HOMOCYSTEINE: Homocysteine: 11.3 umol/L (ref 0.0–14.5)

## 2022-04-29 LAB — TYPE AND SCREEN

## 2022-04-29 LAB — T4, FREE: Free T4: 0.69 ng/dL (ref 0.61–1.12)

## 2022-04-29 LAB — PROTIME-INR
INR: 1.2 (ref 0.8–1.2)
Prothrombin Time: 15 seconds (ref 11.4–15.2)

## 2022-04-29 MED ORDER — PEG 3350-KCL-NA BICARB-NACL 420 G PO SOLR
4000.0000 mL | Freq: Once | ORAL | Status: AC
  Administered 2022-04-29: 4000 mL via ORAL
  Filled 2022-04-29: qty 4000

## 2022-04-29 MED ORDER — SODIUM CHLORIDE 0.9 % IV SOLN
INTRAVENOUS | Status: DC
  Administered 2022-04-30: 10 mL via INTRAVENOUS

## 2022-04-29 NOTE — Consult Note (Signed)
Briana Thompson , MD [address] [place], [place], [place], [address] [address], [place], [place], [place], [address] Phone: [phone]  Fax: [phone]  Consultation  Referring Provider:    Dr Si Raider Primary Care Physician:  Linda Hedges, CNM Primary Gastroenterologist:          None  Reason for Consultation:     Iron deficient anemia  Date of Admission:  04/27/2022 Date of Consultation:  04/29/2022         HPI:   Briana Thompson is a 47 y.o. female underwent outpatient blood work at [place] [facility] and was found to have a hemoglobin of 5.2 g and was sent to the emergency room for further relation.  Iron studies showed a ferritin of 2.  B12 308 underwent blood transfusion and received IV iron.  Seen by hematology.  TSH is elevated.  GI workup recommended.  She denies any rectal bleeding , change in bowel habits, nose bleeds or prolonged heavy menstrual cycles, denies any heartburn , nsaid use , family history of colon cancer or weight loss. No other complaints.   Past Medical History:  Diagnosis Date   BRCA negative 2013   BRCA/BART neg   Enlarged thyroid    Family history of breast cancer 06/2011   Pt is BRCA/BART neg. IBIS=25.85% QYR mammogram/6 mo breast check MRI QYR; Mat aunt BRCA neg   Hypothyroidism    Hypothyroidism    Increased risk of breast cancer 2013   IBIS-25%   Vitamin D deficiency     Past Surgical History:  Procedure Laterality Date   TUBAL LIGATION  01/2007    Prior to Admission medications   Medication Sig Start Date End Date Taking? Authorizing Provider  ibuprofen (ADVIL,MOTRIN) 200 MG tablet Take by mouth every 6 (six) hours as needed.    Yes [provider]  benzonatate (TESSALON) 100 MG capsule Take 1 capsule (100 mg total) by mouth 3 (three) times daily as needed. Patient not taking: Reported on 04/27/2022 04/01/18   Coral Spikes, DO  chlorpheniramine-HYDROcodone [facility]) 10-8 MG/5ML SUER Take 5 mLs by mouth  at bedtime as needed. Patient not taking: Reported on 04/27/2022 04/01/18   Coral Spikes, DO  ipratropium (ATROVENT) 0.06 % nasal spray Place 2 sprays into both nostrils 4 (four) times daily as needed for rhinitis. Patient not taking: Reported on 04/27/2022 04/01/18   Coral Spikes, DO    Family History  Problem Relation Age of Onset   Breast cancer Mother 85   Brain cancer Mother 69   Breast cancer Maternal Aunt 42   Breast cancer Maternal Grandmother 72   Hypertension Father    Diabetes Father    Pancreatic cancer Maternal Uncle      Social History   Tobacco Use   Smoking status: Never   Smokeless tobacco: Never  Vaping Use   Vaping Use: Never used  Substance Use Topics   Alcohol use: Yes    Comment: rare   Drug use: Never    Allergies as of 04/27/2022   (No Known Allergies)    Review of Systems:    All systems reviewed and negative except where noted in HPI.   Physical Exam:  Vital signs in last 24 hours: Temp:  [97.9 F (36.6 C)-98.3 F (36.8 C)] 97.9 F (36.6 C) (02/28 0729) Pulse Rate:  [76-90] 76 (02/28 0729) Resp:  [17-20] 18 (02/28 0729) BP: (124-143)/(52-69) 128/66 (02/28 0729) SpO2:  [98 %-100 %]  99 % (02/28 0729) Weight:  [id number] kg] 117 kg (02/27 1215) Last BM Date : 04/27/22 General:   Pleasant, cooperative in NAD Head:  Normocephalic and atraumatic. Eyes:   No icterus.   Conjunctiva pink. PERRLA. Ears:  Normal auditory acuity. Neck:  Supple; no masses or thyroidomegaly Lungs: Respirations even and unlabored. Lungs clear to auscultation bilaterally.   No wheezes, crackles, or rhonchi.  Heart:  Regular rate and rhythm;  Without murmur, clicks, rubs or gallops Abdomen:  Soft, nondistended, nontender. Normal bowel sounds. No appreciable masses or hepatomegaly.  No rebound or guarding.  Neurologic:  Alert and oriented x3;  grossly normal neurologically. Skin:  Intact without significant lesions or rashes. Cervical Nodes:  No significant cervical  adenopathy. Psych:  Alert and cooperative. Normal affect.  LAB RESULTS: Recent Labs    04/27/22 1433 04/27/22 1722 04/28/22 0454 04/28/22 1339  WBC 5.7 5.6 6.3  --   HGB 5.2* 5.1* 6.3* 7.6*  HCT 21.5* 20.7* 22.5*  --   PLT 294 287 222  --    BMET Recent Labs    04/27/22 1433 04/28/22 1339  NA 136 136  K 3.7 3.7  CL 107 107  CO2 21* 19*  GLUCOSE 119* 124*  BUN 11 8  CREATININE 1.02* 0.87  CALCIUM 8.7* 8.9   LFT Recent Labs    04/28/22 1339  PROT 6.8  ALBUMIN 3.6  AST 18  ALT 9  ALKPHOS 73  BILITOT 1.2  BILIDIR 0.2  IBILI 1.0*   PT/INR Recent Labs    04/27/22 1718  LABPROT 15.5*  INR 1.2    STUDIES: No results found.    Impression / Plan:   [name] is a 47 y.o. y/o female with new finding of severe iron deficiency anemia with no clear overt blood loss.  Plan 1.  IV iron 2.  Celiac serology, urine analysis. H. pylori testing as an outpatient, EGD and colonoscopy tomorrow if negative capsule study of small bowel tomorrow  3.  Monitor CBC and transfuse as needed   I have discussed alternative options, risks & benefits,  which include, but are not limited to, bleeding, infection, perforation,respiratory complication & drug reaction.  The patient agrees with this plan & written consent will be obtained.     Thank you for involving me in the care of this patient.      LOS: 0 days   Briana Bellows, MD  04/29/2022, 8:09 AM

## 2022-04-29 NOTE — Progress Notes (Signed)
  Progress Note   Patient: Briana Thompson [id number] DOB: 29-Sep-1975 DOA: 04/27/2022     0 DOS: the patient was seen and examined on 04/29/2022   Brief hospital course: Admitted for severe anemia that was incidentally discovered at time of routine labwork with PCP.   Principal Problem:   Microcytic anemia Active Problems:   Hypothyroidism   Enlarged thyroid   Iron deficiency anemia   Obesity (BMI 35.0-39.9 without comorbidity)   Metabolic acidosis   Assessment and Plan: Severe iron deficient anemia. Etiology still unclear, she had no history of GI bleed, stool occult blood negative.  She does not have a large menstrual period.  She eats meat regularly.  Possibility of malabsorption.  Folic acid level was normal, B12 borderline, homocysteine pending will order mma Patient has received 3 units PRBC and 2 doses of IV iron.  Hemoglobin seem to be better at 7.9 was 5 on presentation.  Heme following, I have also consulted GI today, tentative plan for endoluminal eval tomorrow  Elevated tsh Mild, with normal t4, advise outpatient f/u  Obesity with BMI of 38. noted    Subjective:  Feeling well, no abd pain  Physical Exam: Vitals:   04/28/22 1230 04/28/22 1921 04/29/22 0427 04/29/22 0729  BP: (!) 143/69 (!) 140/63 (!) 124/55 128/66  Pulse: 82 90 85 76  Resp: 20 18 17 18  $ Temp: 98.1 F (36.7 C) 98.1 F (36.7 C) 98.3 F (36.8 C) 97.9 F (36.6 C)  TempSrc: Oral   Oral  SpO2: 99% 100% 98% 99%  Weight:      Height:       General exam: Appears calm and comfortable  Respiratory system: Clear to auscultation. Respiratory effort normal. Cardiovascular system: S1 & S2 heard, RRR. No JVD, murmurs, rubs, gallops or clicks. No pedal edema. Gastrointestinal system: Abdomen is nondistended, soft and nontender. No organomegaly or masses felt. Normal bowel sounds heard. Central nervous system: Alert and oriented. No focal neurological deficits. Extremities: Symmetric 5 x 5  power. Skin: No rashes, lesions or ulcers Psychiatry: Judgement and insight appear normal. Mood & affect appropriate.    Data Reviewed:  Lab results reviewed.  Family Communication: Sister updated at bedside.  Disposition: Status is: Observation      Time spent: 35 minutes  Author: Desma Maxim, MD 04/29/2022 11:31 AM  For on call review [email or web address].

## 2022-04-30 ENCOUNTER — Telehealth: Payer: Self-pay

## 2022-04-30 ENCOUNTER — Inpatient Hospital Stay: Payer: BC Managed Care – PPO | Admitting: Registered Nurse

## 2022-04-30 ENCOUNTER — Encounter
Admission: EM | Disposition: A | Discharge status: Home / Self Care | Payer: Self-pay | Source: Home / Self Care | Attending: Obstetrics and Gynecology

## 2022-04-30 ENCOUNTER — Encounter: Payer: Self-pay | Admitting: Obstetrics and Gynecology

## 2022-04-30 ENCOUNTER — Other Ambulatory Visit: Payer: Self-pay | Admitting: Oncology

## 2022-04-30 DIAGNOSIS — D509 Iron deficiency anemia, unspecified: Secondary | ICD-10-CM

## 2022-04-30 HISTORY — PX: ESOPHAGOGASTRODUODENOSCOPY (EGD) WITH PROPOFOL: SHX5813

## 2022-04-30 HISTORY — PX: COLONOSCOPY WITH PROPOFOL: SHX5780

## 2022-04-30 LAB — CBC
HCT: 28 % — ABNORMAL LOW (ref 36.0–46.0)
Hemoglobin: 7.7 g/dL — ABNORMAL LOW (ref 12.0–15.0)
MCH: 19.6 pg — ABNORMAL LOW (ref 26.0–34.0)
MCHC: 27.5 g/dL — ABNORMAL LOW (ref 30.0–36.0)
MCV: 71.4 fL — ABNORMAL LOW (ref 80.0–100.0)
Platelets: 234 10*3/uL (ref 150–400)
RBC: 3.92 MIL/uL (ref 3.87–5.11)
RDW: 24.9 % — ABNORMAL HIGH (ref 11.5–15.5)
WBC: 7.8 10*3/uL (ref 4.0–10.5)
nRBC: 0.3 % — ABNORMAL HIGH (ref 0.0–0.2)

## 2022-04-30 LAB — RETIC PANEL
Immature Retic Fract: 47.6 % — ABNORMAL HIGH (ref 2.3–15.9)
RBC.: 3.95 MIL/uL (ref 3.87–5.11)
Retic Count, Absolute: 84.5 10*3/uL (ref 19.0–186.0)
Retic Ct Pct: 2.1 % (ref 0.4–3.1)
Reticulocyte Hemoglobin: 20.8 pg — ABNORMAL LOW (ref >27.9)

## 2022-04-30 LAB — CELIAC DISEASE PANEL
Endomysial Ab, IgA: NEGATIVE
IgA: 211 mg/dL (ref 87–352)
Tissue Transglutaminase Ab, IgA: <2 U/mL (ref 0–3)

## 2022-04-30 SURGERY — COLONOSCOPY WITH PROPOFOL
Anesthesia: General

## 2022-04-30 MED ORDER — SODIUM CHLORIDE 0.9 % IV SOLN: INTRAVENOUS | Status: DC

## 2022-04-30 MED ORDER — PROPOFOL 10 MG/ML IV BOLUS
INTRAVENOUS | Status: DC | PRN
  Administered 2022-04-30: 80 mg via INTRAVENOUS

## 2022-04-30 MED ORDER — PROPOFOL 500 MG/50ML IV EMUL
INTRAVENOUS | Status: DC | PRN
  Administered 2022-04-30: 170 ug/kg/min via INTRAVENOUS

## 2022-04-30 MED ORDER — FERROUS SULFATE 325 (65 FE) MG PO TABS: 325.0000 mg | ORAL_TABLET | ORAL | 3 refills | Status: AC

## 2022-04-30 MED ORDER — SODIUM CHLORIDE 0.9 % IV SOLN
200.0000 mg | Freq: Once | INTRAVENOUS | Status: DC
  Filled 2022-04-30: qty 10

## 2022-04-30 MED ORDER — SODIUM CHLORIDE 0.9 % IV SOLN
200.0000 mg | Freq: Once | INTRAVENOUS | Status: AC
  Administered 2022-04-30: 200 mg via INTRAVENOUS
  Filled 2022-04-30: qty 10
  Filled 2022-04-30: qty 200

## 2022-04-30 MED ORDER — LIDOCAINE HCL (CARDIAC) PF 100 MG/5ML IV SOSY
PREFILLED_SYRINGE | INTRAVENOUS | Status: DC | PRN
  Administered 2022-04-30: 60 mg via INTRAVENOUS

## 2022-04-30 MED ORDER — PHENYLEPHRINE HCL (PRESSORS) 10 MG/ML IV SOLN
INTRAVENOUS | Status: DC | PRN
  Administered 2022-04-30: 80 ug via INTRAVENOUS

## 2022-04-30 NOTE — Anesthesia Preprocedure Evaluation (Addendum)
Anesthesia Evaluation  Patient identified by MRN, date of birth, ID band Patient awake    Reviewed: Allergy & Precautions, NPO status , Patient's Chart, lab work & pertinent test results  History of Anesthesia Complications Negative for: history of anesthetic complications  Airway Mallampati: I   Neck ROM: Full    Dental no notable dental hx.    Pulmonary neg pulmonary ROS   Pulmonary exam normal breath sounds clear to auscultation       Cardiovascular Exercise Tolerance: Good negative cardio ROS Normal cardiovascular exam Rhythm:Regular Rate:Normal     Neuro/Psych negative neurological ROS     GI/Hepatic negative GI ROS,,,  Endo/Other  Hypothyroidism  Obesity   Renal/GU negative Renal ROS     Musculoskeletal   Abdominal   Peds  Hematology  (+) Blood dyscrasia, anemia   Anesthesia Other Findings   Reproductive/Obstetrics                             Anesthesia Physical Anesthesia Plan  ASA: 2  Anesthesia Plan: General   Post-op Pain Management:    Induction: Intravenous  PONV Risk Score and Plan: 3 and Propofol infusion, TIVA and Treatment may vary due to age or medical condition  Airway Management Planned: Natural Airway  Additional Equipment:   Intra-op Plan:   Post-operative Plan:   Informed Consent: I have reviewed the patients History and Physical, chart, labs and discussed the procedure including the risks, benefits and alternatives for the proposed anesthesia with the patient or authorized representative who has indicated his/her understanding and acceptance.       Plan Discussed with: CRNA  Anesthesia Plan Comments: (LMA/GETA backup discussed.  Patient consented for risks of anesthesia including but not limited to:  - adverse reactions to medications - damage to eyes, teeth, lips or other oral mucosa - nerve damage due to positioning  - sore throat or  hoarseness - damage to heart, brain, nerves, lungs, other parts of body or loss of life  Informed patient about role of CRNA in peri- and intra-operative care.  Patient voiced understanding.)       Anesthesia Quick Evaluation

## 2022-04-30 NOTE — Telephone Encounter (Signed)
-----   Message from [name], MD sent at 04/30/2022  3:47 PM EST ----- This is a patient with IDA, seen by me during hospitalization.  She needs a follow up visit with me 1 week, lab MD +/- Venofer. - check cbc retic panel. Thanks.

## 2022-04-30 NOTE — Discharge Summary (Signed)
Briana Thompson [id number] DOB: 1975-06-06 DOA: 04/27/2022  PCP: Linda Hedges, CNM  Admit date: 04/27/2022 Discharge date: 04/30/2022  Time spent: 40 minutes  Recommendations for Outpatient Follow-up:  Gi, pcp, and hematology f/u  Repeat TFTs in 4-6 wks    Discharge Diagnoses:  Principal Problem:   Microcytic anemia Active Problems:   Hypothyroidism   Enlarged thyroid   Iron deficiency anemia   Obesity (BMI 35.0-39.9 without comorbidity)   Metabolic acidosis   Discharge Condition: stable  Diet recommendation: regular  Filed Weights   04/28/22 1215 04/30/22 1146  Weight: 117 kg 117 kg    History of present illness:  From admission h and p Briana Thompson is a 47 y.o. female with medical history significant of enlarged thyroid (patient denies history of hypothyroidism), who presents with low hemoglobin.   Pt states that she was seen as first-time visit in [place] [facility] and found to have low hemoglobin 5.2. She was sent to ED for further evaluation and treatment.  Patient states that she has very mild weakness sometimes, not today.  Denies lightheadedness, dizziness, chest pain, shortness breath.  She is taking ibuprofen, but denies rectal bleeding or dark stool.  She states that her menstrual period has been regular and not heavy.  Last menstrual was on 04/03/2022.  Hospital Course:  Patient presents referred from PCP when severe iron deficiency anemia was discovered doing routine labs. No melena or hematochezia, reports normal menses. Hgb 5, transfused 3 units PRBCs and also given IV iron. GI consulted, upper endoscopy and colonoscopy performed 2/29, normal results. Hematology also consulted, celiac and other labs pending at time of discharge. Mild tsh elevation with normal free t4 also noted. Stable for discharge, will need close pcp f/u to monitor anemia, consider birth control to limit menses (bleeding not heavy but would benefit from cessation of menses). GI plan is  outpatient capsule endoscopy, they will contact patient to arrange that in the next 1-2 weeks. Also advise outpatient hematology f/u to review outstanding labs, possibly continue IV iron infusions, and to pursue continued workup should that prove necessary.  Procedures:  Upper endoscopy, colonoscopy  Consultations: Gi, hematology  Discharge Exam: Vitals:   04/30/22 1308 04/30/22 1346  BP: 120/70 130/69  Pulse:  68  Resp:  16  Temp:  98.1 F (36.7 C)  SpO2:  100%    General: NAD Cardiovascular: RRR Respiratory: CTAB  Discharge Instructions   Discharge Instructions     Diet - low sodium heart healthy   Complete by: As directed    Increase activity slowly   Complete by: As directed       Allergies as of 04/30/2022   No Known Allergies      Medication List     STOP taking these medications    benzonatate 100 MG capsule Commonly known as: TESSALON   chlorpheniramine-HYDROcodone 10-8 MG/5ML Suer Commonly known as: Tussionex Pennkinetic ER   ibuprofen 200 MG tablet Commonly known as: ADVIL   ipratropium 0.06 % nasal spray Commonly known as: ATROVENT       TAKE these medications    ferrous sulfate 325 (65 FE) MG tablet Take 1 tablet (325 mg total) by mouth every other day.       No Known Allergies  Follow-up Information     Linda Hedges, CNM Follow up.   Specialty: Certified Nurse Midwife Why: 1 week Contact information: [place] [address] [place],  [name], MD Follow up.   Specialty: Gastroenterology Why: follow up after completion of capsule study Contact information: [place] [address] [phone]         [name], MD Follow up.   Specialty: Oncology Contact information: [place] [address] [phone]                  The results of significant diagnostics from this hospitalization (including imaging, microbiology,  ancillary and laboratory) are listed below for reference.    Significant Diagnostic Studies: No results found.  Microbiology: No results found for this or any previous visit (from the past 240 hour(s)).   Labs: Basic Metabolic Panel: Recent Labs  Lab 04/27/22 1433 04/28/22 1339 04/29/22 0811  NA 136 136 136  K 3.7 3.7 3.6  CL 107 107 108  CO2 21* 19* 20*  GLUCOSE 119* 124* 104*  BUN '11 8 9  '$ CREATININE 1.02* 0.87 0.79  CALCIUM 8.7* 8.9 8.7*  MG  --   --  2.3  PHOS  --   --  4.0   Liver Function Tests: Recent Labs  Lab 04/27/22 1433 04/28/22 1339  AST 16 18  ALT 9 9  ALKPHOS 83 73  BILITOT 0.6 1.2  PROT 7.3 6.8  ALBUMIN 4.1 3.6   No results for input(s): "LIPASE", "AMYLASE" in the last 168 hours. No results for input(s): "AMMONIA" in the last 168 hours. CBC: Recent Labs  Lab 04/27/22 1433 04/27/22 1722 04/28/22 0454 04/28/22 1339 04/29/22 0811 04/30/22 0301  WBC 5.7 5.6 6.3  --  6.8 7.8  NEUTROABS  --  4.0  --   --   --   --   HGB 5.2* 5.1* 6.3* 7.6* 7.9* 7.7*  HCT 21.5* 20.7* 22.5*  --  27.5* 28.0*  MCV 64.6* 63.7* 67.4*  --  69.4* 71.4*  PLT 294 287 222  --  271 234   Cardiac Enzymes: No results for input(s): "CKTOTAL", "CKMB", "CKMBINDEX", "TROPONINI" in the last 168 hours. BNP: BNP (last 3 results) No results for input(s): "BNP" in the last 8760 hours.  ProBNP (last 3 results) No results for input(s): "PROBNP" in the last 8760 hours.  CBG: No results for input(s): "GLUCAP" in the last 168 hours.     Signed:  Desma Maxim MD.  [facility] 04/30/2022, 2:16 PM

## 2022-04-30 NOTE — Op Note (Signed)
[facility] Gastroenterology Patient Name: Briana Thompson Procedure Date: 04/30/2022 12:19 PM MRN: [id number] Account #: [id number] Date of Birth: 12-21-1975 Admit Type: Inpatient Age: 47 Room: [facility] ENDO ROOM 4 Gender: Female Note Status: Finalized Instrument Name: Upper Endoscope [id number] Procedure:             Upper GI endoscopy Indications:           Iron deficiency anemia Providers:             Jonathon Bellows MD, MD Referring MD:          No Local Md, MD (Referring MD) Medicines:             Monitored Anesthesia Care Complications:         No immediate complications. Procedure:             Pre-Anesthesia Assessment:                        - Prior to the procedure, a History and Physical was                         performed, and patient medications, allergies and                         sensitivities were reviewed. The patient's tolerance                         of previous anesthesia was reviewed.                        - The risks and benefits of the procedure and the                         sedation options and risks were discussed with the                         patient. All questions were answered and informed                         consent was obtained.                        - ASA Grade Assessment: II - A patient with mild                         systemic disease.                        After obtaining informed consent, the endoscope was                         passed under direct vision. Throughout the procedure,                         the patient's blood pressure, pulse, and oxygen                         saturations were monitored continuously. The Endoscope  was introduced through the mouth, and advanced to the                         third part of duodenum. The upper GI endoscopy was                         accomplished with ease. The patient tolerated the                         procedure well. Findings:      The esophagus  was normal.      The stomach was normal.      The examined duodenum was normal. Biopsies were taken with a cold       forceps for histology.      The cardia and gastric fundus were normal on retroflexion. Impression:            - Normal esophagus.                        - Normal stomach.                        - Normal examined duodenum. Biopsied. Recommendation:        - Await pathology results.                        - Perform a colonoscopy today. Procedure Code(s):     --- Professional ---                        [phone], Esophagogastroduodenoscopy, flexible,                         transoral; with biopsy, single or multiple Diagnosis Code(s):     --- Professional ---                        D50.9, Iron deficiency anemia, unspecified CPT copyright 2022 American Medical Association. All rights reserved. The codes documented in this report are preliminary and upon coder review may  be revised to meet current compliance requirements. Jonathon Bellows, MD Jonathon Bellows MD, MD 04/30/2022 [id number] PM This report has been signed electronically. Number of Addenda: 0 Note Initiated On: 04/30/2022 12:19 PM Estimated Blood Loss:  Estimated blood loss: none.      [facility]

## 2022-04-30 NOTE — Transfer of Care (Signed)
Immediate Anesthesia Transfer of Care Note  Patient: Briana Thompson  Procedure(s) Performed: COLONOSCOPY WITH PROPOFOL ESOPHAGOGASTRODUODENOSCOPY (EGD) WITH PROPOFOL  Patient Location: PACU  Anesthesia Type:General  Level of Consciousness: awake, alert , and oriented  Airway & Oxygen Therapy: Patient Spontanous Breathing  Post-op Assessment: Report given to RN and Post -op Vital signs reviewed and stable  Post vital signs: Reviewed and stable  Last Vitals:  Vitals Value Taken Time  BP 92/53 04/30/22 1248  Temp    Pulse 76 04/30/22 1248  Resp 18 04/30/22 1248  SpO2 89 % 04/30/22 1248  Vitals shown include unvalidated device data.  Last Pain:  Vitals:   04/30/22 1146  TempSrc: Temporal  PainSc: 0-No pain         Complications: No notable events documented.

## 2022-04-30 NOTE — Op Note (Signed)
[facility] Patient Name: Briana Thompson Procedure Date: 04/30/2022 12:18 PM MRN: [id number] Account #: [id number] Date of Birth: 1975/07/20 Admit Type: Inpatient Age: 47 Room: [facility] ENDO ROOM 4 Gender: Female Note Status: Finalized Instrument Name: Jasper Riling [id number] Procedure:             Colonoscopy Indications:           Iron deficiency anemia Providers:             Jonathon Bellows MD, MD Referring MD:          No Local Md, MD (Referring MD) Medicines:             Monitored Anesthesia Care Complications:         No immediate complications. Procedure:             Pre-Anesthesia Assessment:                        - Prior to the procedure, a History and Physical was                         performed, and patient medications, allergies and                         sensitivities were reviewed. The patient's tolerance                         of previous anesthesia was reviewed.                        - The risks and benefits of the procedure and the                         sedation options and risks were discussed with the                         patient. All questions were answered and informed                         consent was obtained.                        - ASA Grade Assessment: II - A patient with mild                         systemic disease.                        After obtaining informed consent, the colonoscope was                         passed under direct vision. Throughout the procedure,                         the patient's blood pressure, pulse, and oxygen                         saturations were monitored continuously. The                         Colonoscope was introduced through  the anus and                         advanced to the the cecum, identified by the                         appendiceal orifice. The colonoscopy was performed                         with ease. The patient tolerated the procedure well.                          The quality of the bowel preparation was adequate. The                         ileocecal valve, appendiceal orifice, and rectum were                         photographed. Findings:      The entire examined colon appeared normal on direct and retroflexion       views. Impression:            - The entire examined colon is normal on direct and                         retroflexion views.                        - No specimens collected. Recommendation:        - Return patient to hospital ward for ongoing care.                        - Advance diet as tolerated.                        - Continue present medications.                        - To visualize the small bowel, perform video capsule                         endoscopy in 2 weeks.                        - Return to my office in 4 weeks. Procedure Code(s):     --- Professional ---                        [phone], Colonoscopy, flexible; diagnostic, including                         collection of specimen(s) by brushing or washing, when                         performed (separate procedure) Diagnosis Code(s):     --- Professional ---                        D50.9, Iron deficiency anemia, unspecified CPT copyright 2022 American Medical Association. All rights reserved. The codes documented in this report are preliminary and upon  coder review may  be revised to meet current compliance requirements. Jonathon Bellows, MD Jonathon Bellows MD, MD 04/30/2022 12:45:58 PM This report has been signed electronically. Number of Addenda: 0 Note Initiated On: 04/30/2022 12:18 PM Scope Withdrawal Time: 0 hours 11 minutes 18 seconds  Total Procedure Duration: 0 hours 14 minutes 17 seconds  Estimated Blood Loss:  Estimated blood loss: none.      [facility]

## 2022-04-30 NOTE — Progress Notes (Signed)
Pt d/c to home via spouse. Ivs removed intact. VSS. Eduation completed. All questions answered. All belongings sent with pt.

## 2022-04-30 NOTE — Anesthesia Postprocedure Evaluation (Signed)
Anesthesia Post Note  Patient: JORIE BETZ  Procedure(s) Performed: COLONOSCOPY WITH PROPOFOL ESOPHAGOGASTRODUODENOSCOPY (EGD) WITH PROPOFOL  Patient location during evaluation: PACU Anesthesia Type: General Level of consciousness: awake and alert, oriented and patient cooperative Pain management: pain level controlled Vital Signs Assessment: post-procedure vital signs reviewed and stable Respiratory status: spontaneous breathing, nonlabored ventilation and respiratory function stable Cardiovascular status: blood pressure returned to baseline and stable Postop Assessment: adequate PO intake Anesthetic complications: no   No notable events documented.   Last Vitals:  Vitals:   04/30/22 1258 04/30/22 1308  BP: (!) 108/59 120/70  Pulse:    Resp:    Temp:    SpO2:      Last Pain:  Vitals:   04/30/22 1308  TempSrc:   PainSc: 0-No pain                 Darrin Nipper

## 2022-04-30 NOTE — H&P (Signed)
Jonathon Bellows, MD [address] [place], [place], [place], [address] [address], [place], [place], [place], [address] Phone: [phone]  Fax: [phone]  Primary Care Physician:  Linda Hedges, CNM   Pre-Procedure History & Physical: HPI:  Briana Thompson is a 47 y.o. female is here for an endoscopy and colonoscopy    Past Medical History:  Diagnosis Date   BRCA negative 2013   BRCA/BART neg   Enlarged thyroid    Family history of breast cancer 06/2011   Pt is BRCA/BART neg. IBIS=25.85% QYR mammogram/6 mo breast check MRI QYR; Mat aunt BRCA neg   Hypothyroidism    Hypothyroidism    Increased risk of breast cancer 2013   IBIS-25%   Vitamin D deficiency     Past Surgical History:  Procedure Laterality Date   TUBAL LIGATION  01/2007    Prior to Admission medications   Medication Sig Start Date End Date Taking? Authorizing Provider  ibuprofen (ADVIL,MOTRIN) 200 MG tablet Take by mouth every 6 (six) hours as needed.    Yes [provider]  benzonatate (TESSALON) 100 MG capsule Take 1 capsule (100 mg total) by mouth 3 (three) times daily as needed. Patient not taking: Reported on 04/27/2022 04/01/18   Coral Spikes, DO  chlorpheniramine-HYDROcodone [facility]) 10-8 MG/5ML SUER Take 5 mLs by mouth at bedtime as needed. Patient not taking: Reported on 04/27/2022 04/01/18   Coral Spikes, DO  ipratropium (ATROVENT) 0.06 % nasal spray Place 2 sprays into both nostrils 4 (four) times daily as needed for rhinitis. Patient not taking: Reported on 04/27/2022 04/01/18   Coral Spikes, DO    Allergies as of 04/27/2022   (No Known Allergies)    Family History  Problem Relation Age of Onset   Breast cancer Mother 65   Brain cancer Mother 23   Breast cancer Maternal Aunt 16   Breast cancer Maternal Grandmother 26   Hypertension Father    Diabetes Father    Pancreatic cancer Maternal Uncle     Social History   Socioeconomic History   Marital  status: Married    Spouse name: Not on file   Number of children: Not on file   Years of education: Not on file   Highest education level: Not on file  Occupational History   Not on file  Tobacco Use   Smoking status: Never   Smokeless tobacco: Never  Vaping Use   Vaping Use: Never used  Substance and Sexual Activity   Alcohol use: Yes    Comment: rare   Drug use: Never   Sexual activity: Yes    Birth control/protection: Surgical    Comment: tubal   Other Topics Concern   Not on file  Social History Narrative   Not on file   Social Determinants of Health   Financial Resource Strain: Not on file  Food Insecurity: No Food Insecurity (04/27/2022)   Hunger Vital Sign    Worried About Running Out of Food in the Last Year: Never true    Ran Out of Food in the Last Year: Never true  Transportation Needs: No Transportation Needs (04/27/2022)   PRAPARE - Hydrologist (Medical): No    Lack of Transportation (Non-Medical): No  Physical Activity: Not on file  Stress: Not on file  Social Connections: Not on file  Intimate Partner Violence: Not At Risk (04/27/2022)   Humiliation, Afraid, Rape, and Kick questionnaire  Fear of Current or Ex-Partner: No    Emotionally Abused: No    Physically Abused: No    Sexually Abused: No    Review of Systems: See HPI, otherwise negative ROS  Physical Exam: BP 137/63   Pulse 87   Temp 97.6 F (36.4 C) (Temporal)   Resp 18   Ht '5\' 9"'$  (1.753 m)   Wt 117 kg   SpO2 100%   BMI 38.09 kg/m  General:   Alert,  pleasant and cooperative in NAD Head:  Normocephalic and atraumatic. Neck:  Supple; no masses or thyromegaly. Lungs:  Clear throughout to auscultation, normal respiratory effort.    Heart:  +S1, +S2, Regular rate and rhythm, No edema. Abdomen:  Soft, nontender and nondistended. Normal bowel sounds, without guarding, and without rebound.   Neurologic:  Alert and  oriented x4;  grossly normal  neurologically.  Impression/Plan: [name] is here for an endoscopy and colonoscopy  to be performed for  evaluation of iron deficiency anemia    Risks, benefits, limitations, and alternatives regarding endoscopy have been reviewed with the patient.  Questions have been answered.  All parties agreeable.   Jonathon Bellows, MD  04/30/2022, 12:22 PM

## 2022-05-01 ENCOUNTER — Telehealth: Payer: Self-pay

## 2022-05-01 ENCOUNTER — Encounter: Payer: Self-pay | Admitting: Oncology

## 2022-05-01 ENCOUNTER — Other Ambulatory Visit: Payer: Self-pay

## 2022-05-01 ENCOUNTER — Encounter: Payer: Self-pay | Admitting: Gastroenterology

## 2022-05-01 DIAGNOSIS — D509 Iron deficiency anemia, unspecified: Secondary | ICD-10-CM

## 2022-05-01 LAB — BPAM RBC
Blood Product Expiration Date: 202402292359
Blood Product Expiration Date: 202403242359
Blood Product Expiration Date: 202403312359
Blood Product Expiration Date: 202404012359
Blood Product Expiration Date: 202404022359
ISSUE DATE / TIME: 202402262024
ISSUE DATE / TIME: 202402262349
ISSUE DATE / TIME: 202402270926
Unit Type and Rh: 600
Unit Type and Rh: 9500
Unit Type and Rh: 9500
Unit Type and Rh: 9500
Unit Type and Rh: 9500

## 2022-05-01 LAB — TYPE AND SCREEN
ABO/RH(D): A NEG
Antibody Screen: POSITIVE
Donor AG Type: NEGATIVE
Donor AG Type: NEGATIVE
Donor AG Type: NEGATIVE
Unit division: 0
Unit division: 0
Unit division: 0
Unit division: 0
Unit division: 0

## 2022-05-01 LAB — SURGICAL PATHOLOGY

## 2022-05-01 NOTE — Telephone Encounter (Signed)
Patient called back and she was able to schedule her capsule study for 05/20/2022. I let her know that I would be sending her the instructions via MyChart and she agreed. Patient had no further questions.

## 2022-05-01 NOTE — Telephone Encounter (Signed)
Dr. Vicente Males messaged me to get in contact with the patient so I could schedule her a capsule study in 1-3 weeks from yesterday. Then she is to have a follow up appointment with Dr. Vicente Males. I then called patient and had to leave her a voicemail to call me back so I could schedule her the capsule study and follow up appointment with Dr. Vicente Males.

## 2022-05-02 LAB — ZINC: Zinc: 84 ug/dL (ref 44–115)

## 2022-05-02 LAB — VITAMIN K1, SERUM: VITAMIN K1: 0.2 ng/mL (ref 0.10–2.20)

## 2022-05-02 LAB — COPPER, SERUM: Copper: 95 ug/dL (ref 80–158)

## 2022-05-04 ENCOUNTER — Encounter: Payer: Self-pay | Admitting: Oncology

## 2022-05-05 MED FILL — Iron Sucrose Inj 20 MG/ML (Fe Equiv): INTRAVENOUS | Qty: 10 | Status: AC

## 2022-05-06 ENCOUNTER — Encounter: Payer: Self-pay | Admitting: Oncology

## 2022-05-06 ENCOUNTER — Inpatient Hospital Stay: Payer: BC Managed Care – PPO | Attending: Oncology | Admitting: Oncology

## 2022-05-06 ENCOUNTER — Encounter: Payer: Self-pay | Admitting: Gastroenterology

## 2022-05-06 ENCOUNTER — Inpatient Hospital Stay: Payer: BC Managed Care – PPO

## 2022-05-06 VITALS — BP 140/65 | HR 83 | Temp 96.0°F | Resp 18 | Wt 261.6 lb

## 2022-05-06 DIAGNOSIS — Z79899 Other long term (current) drug therapy: Secondary | ICD-10-CM | POA: Diagnosis not present

## 2022-05-06 DIAGNOSIS — E538 Deficiency of other specified B group vitamins: Secondary | ICD-10-CM | POA: Diagnosis not present

## 2022-05-06 DIAGNOSIS — D509 Iron deficiency anemia, unspecified: Secondary | ICD-10-CM | POA: Insufficient documentation

## 2022-05-06 DIAGNOSIS — Z803 Family history of malignant neoplasm of breast: Secondary | ICD-10-CM | POA: Diagnosis not present

## 2022-05-06 DIAGNOSIS — Z808 Family history of malignant neoplasm of other organs or systems: Secondary | ICD-10-CM | POA: Insufficient documentation

## 2022-05-06 DIAGNOSIS — Z800 Family history of malignant neoplasm of digestive organs: Secondary | ICD-10-CM | POA: Insufficient documentation

## 2022-05-06 DIAGNOSIS — E039 Hypothyroidism, unspecified: Secondary | ICD-10-CM | POA: Insufficient documentation

## 2022-05-06 DIAGNOSIS — D508 Other iron deficiency anemias: Secondary | ICD-10-CM | POA: Diagnosis not present

## 2022-05-06 LAB — CBC WITH DIFFERENTIAL (CANCER CENTER ONLY)
Abs Immature Granulocytes: 0.07 10*3/uL (ref 0.00–0.07)
Basophils Absolute: 0 10*3/uL (ref 0.0–0.1)
Basophils Relative: 0 %
Eosinophils Absolute: 0.1 10*3/uL (ref 0.0–0.5)
Eosinophils Relative: 2 %
HCT: 31.7 % — ABNORMAL LOW (ref 36.0–46.0)
Hemoglobin: 9 g/dL — ABNORMAL LOW (ref 12.0–15.0)
Immature Granulocytes: 2 %
Lymphocytes Relative: 21 %
Lymphs Abs: 1 10*3/uL (ref 0.7–4.0)
MCH: 21.6 pg — ABNORMAL LOW (ref 26.0–34.0)
MCHC: 28.4 g/dL — ABNORMAL LOW (ref 30.0–36.0)
MCV: 76.2 fL — ABNORMAL LOW (ref 80.0–100.0)
Monocytes Absolute: 0.2 10*3/uL (ref 0.1–1.0)
Monocytes Relative: 4 %
Neutro Abs: 3.4 10*3/uL (ref 1.7–7.7)
Neutrophils Relative %: 71 %
Platelet Count: 178 10*3/uL (ref 150–400)
RBC: 4.16 MIL/uL (ref 3.87–5.11)
RDW: 29.2 % — ABNORMAL HIGH (ref 11.5–15.5)
WBC Count: 4.7 10*3/uL (ref 4.0–10.5)
nRBC: 0 % (ref 0.0–0.2)

## 2022-05-06 LAB — RETIC PANEL
Immature Retic Fract: 24 % — ABNORMAL HIGH (ref 2.3–15.9)
RBC.: 4.08 MIL/uL (ref 3.87–5.11)
Retic Count, Absolute: 88.9 10*3/uL (ref 19.0–186.0)
Retic Ct Pct: 2.2 % (ref 0.4–3.1)
Reticulocyte Hemoglobin: 23.3 pg — ABNORMAL LOW (ref >27.9)

## 2022-05-06 LAB — METHYLMALONIC ACID, SERUM: Methylmalonic Acid, Quantitative: 456 nmol/L — ABNORMAL HIGH (ref 0–378)

## 2022-05-06 MED ORDER — VITAMIN B-12 1000 MCG PO TABS: 1000.0000 ug | ORAL_TABLET | Freq: Every day | ORAL | 3 refills | Status: AC

## 2022-05-06 NOTE — Progress Notes (Signed)
Hematology/Oncology Progress note [phone] [phone]     CHIEF COMPLAINTS/REASON FOR VISIT:  Follow-up for iron deficiency   ASSESSMENT & PLAN:   Iron deficiency anemia Today's blood work showed improved Hb level. Retic hemoglobin level improved, still low, consistent iron deficiency anemia.  Recommend additional IV venofer weekly x 3 Follow up in 3 months.   B12 deficiency B12 is borderline, increase MMA.  Recommend patient to take B12 1026mg daily.   Orders Placed This Encounter  Procedures   Ferritin    Standing Status:   Future    Standing Expiration Date:   11/06/2022   Iron and TIBC    Standing Status:   Future    Standing Expiration Date:   05/06/2023   Vitamin B12    Standing Status:   Future    Standing Expiration Date:   05/06/2023   CBC with Differential/Platelet    Standing Status:   Future    Standing Expiration Date:   05/06/2023   Retic Panel    Standing Status:   Future    Standing Expiration Date:   05/06/2023   TSH    Standing Status:   Future    Standing Expiration Date:   05/06/2023   Follow-up in 3 months All questions were answered. The patient knows to call the clinic with any problems, questions or concerns.  ZEarlie Server MD, PhD [facility] 05/06/2022   HISTORY OF PRESENTING ILLNESS:   Briana LANGOWSKIis a  47y.o.  female presents for follow-up of iron deficiency anemia. She was seen by me during her recent admission 04/27/2022 - 04/30/2022. Patient presented with hemoglobin of 5.2, iron saturation 4, ferritin 10.  B12 0 8, MMA 456.   Celiac panel is negative.  Normal zinc, normal copper level, Patient reports that her menstrual period is not currently. Patient was transfused with 3 units of PRBC as well as IV Venofer 200 mg x 3 during her admission. Patient was seen by GI during the admission.  Upper endoscopy and colonoscopy performed on 04/30/2022, normal results.  Biopsy of the small bowel showed mild  reactive duodenopathy, nonspecific.  She is scheduled to get capsule study done outpatient.  Today patient reports feeling well, fatigue has improved.  She takes oral iron supplementation.  Denies nausea vomiting diarrhea, bloody stool, chest pain palpitation.  MEDICAL HISTORY:  Past Medical History:  Diagnosis Date   BRCA negative 2013   BRCA/BART neg   Enlarged thyroid    Family history of breast cancer 06/2011   Pt is BRCA/BART neg. IBIS=25.85% QYR mammogram/6 mo breast check MRI QYR; Mat aunt BRCA neg   Hypothyroidism    Hypothyroidism    Increased risk of breast cancer 2013   IBIS-25%   Vitamin D deficiency     SURGICAL HISTORY: Past Surgical History:  Procedure Laterality Date   COLONOSCOPY WITH PROPOFOL N/A 04/30/2022   Procedure: COLONOSCOPY WITH PROPOFOL;  Surgeon: AJonathon Bellows MD;  [facility]  Service: Gastro[facility]ology;  Laterality: N/A;   ESOPHAGOGASTRODUODENOSCOPY (EGD) WITH PROPOFOL N/A 04/30/2022   Procedure: ESOPHAGOGASTRODUODENOSCOPY (EGD) WITH PROPOFOL;  Surgeon: AJonathon Bellows MD;  [facility];  Service: Gastroenterology;  Laterality: N/A;   TUBAL LIGATION  01/2007    SOCIAL HISTORY: Social History   Socioeconomic History   Marital status: Married    Spouse name: Not on file   Number of children: Not on file   Years of education: Not on file   Highest education level:  Not on file  Occupational History   Not on file  Tobacco Use   Smoking status: Never   Smokeless tobacco: Never  Vaping Use   Vaping Use: Never used  Substance and Sexual Activity   Alcohol use: Yes    Comment: rare   Drug use: Never   Sexual activity: Yes    Birth control/protection: Surgical    Comment: tubal   Other Topics Concern   Not on file  Social History Narrative   Not on file   Social Determinants of Health   Financial Resource Strain: Not on file  Food Insecurity: No Food Insecurity (04/27/2022)   Hunger Vital Sign    Worried About Running  Out of Food in the Last Year: Never true    Ran Out of Food in the Last Year: Never true  Transportation Needs: No Transportation Needs (04/27/2022)   PRAPARE - Transportation    Lack of Transportation (Medical): No    Lack of Transportation (Non-Medical): No  Physical Activity: Not on file  Stress: Not on file  Social Connections: Not on file  Intimate Partner Violence: Not At Risk (04/27/2022)   Humiliation, Afraid, Rape, and Kick questionnaire    Fear of Current or Ex-Partner: No    Emotionally Abused: No    Physically Abused: No    Sexually Abused: No    FAMILY HISTORY: Family History  Problem Relation Age of Onset   Breast cancer Mother 40   Brain cancer Mother 40   Breast cancer Maternal Aunt 42   Breast cancer Maternal Grandmother 42   Hypertension Father    Diabetes Father    Pancreatic cancer Maternal Uncle     ALLERGIES:  has No Known Allergies.  MEDICATIONS:  Current Outpatient Medications  Medication Sig Dispense Refill   cyanocobalamin (VITAMIN B12) 1000 MCG tablet Take 1 tablet (1,000 mcg total) by mouth daily. 30 tablet 3   ferrous sulfate 325 (65 FE) MG tablet Take 1 tablet (325 mg total) by mouth every other day. 30 tablet 3   No current facility-administered medications for this visit.    Review of Systems  Constitutional:  Positive for fatigue. Negative for appetite change, chills and fever.  HENT:   Negative for hearing loss and voice change.   Eyes:  Negative for eye problems.  Respiratory:  Negative for chest tightness and cough.   Cardiovascular:  Negative for chest pain.  Gastrointestinal:  Negative for abdominal distention, abdominal pain and blood in stool.  Endocrine: Negative for hot flashes.  Genitourinary:  Negative for difficulty urinating and frequency.   Musculoskeletal:  Negative for arthralgias.  Skin:  Negative for itching and rash.  Neurological:  Negative for extremity weakness.  Hematological:  Negative for adenopathy.   Psychiatric/Behavioral:  Negative for confusion.    PHYSICAL EXAMINATION:  Vitals:   05/06/22 0908  BP: (!) 140/65  Pulse: 83  Resp: 18  Temp: (!) 96 F (35.6 C)  SpO2: 99%   Filed Weights   05/06/22 0908  Weight: 261 lb 9.6 oz (118.7 kg)    Physical Exam Constitutional:      General: She is not in acute distress. HENT:     Head: Normocephalic and atraumatic.  Eyes:     General: No scleral icterus. Cardiovascular:     Rate and Rhythm: Normal rate.  Pulmonary:     Effort: Pulmonary effort is normal. No respiratory distress.  Abdominal:     General: There is no distension.  Musculoskeletal:  General: No deformity. Normal range of motion.     Cervical back: Normal range of motion.  Skin:    Findings: No erythema or rash.  Neurological:     Mental Status: She is alert and oriented to person, place, and time. Mental status is at baseline.     Cranial Nerves: No cranial nerve deficit.  Psychiatric:        Mood and Affect: Mood normal.     LABORATORY DATA:  I have reviewed the data as listed    Latest Ref Rng & Units 05/06/2022    9:43 AM 04/30/2022    3:01 AM 04/29/2022    8:11 AM  CBC  WBC 4.0 - 10.5 K/uL 4.7  7.8  6.8   Hemoglobin 12.0 - 15.0 g/dL 9.0  7.7  7.9   Hematocrit 36.0 - 46.0 % 31.7  28.0  27.5   Platelets 150 - 400 K/uL 178  234  271       Latest Ref Rng & Units 04/29/2022    8:11 AM 04/28/2022    1:39 PM 04/27/2022    2:33 PM  CMP  Glucose 70 - 99 mg/dL 104  124  119   BUN 6 - 20 mg/dL '9  8  11   '$ Creatinine 0.44 - 1.00 mg/dL 0.79  0.87  1.02   Sodium 135 - 145 mmol/L 136  136  136   Potassium 3.5 - 5.1 mmol/L 3.6  3.7  3.7   Chloride 98 - 111 mmol/L 108  107  107   CO2 22 - 32 mmol/L '20  19  21   '$ Calcium 8.9 - 10.3 mg/dL 8.7  8.9  8.7   Total Protein 6.5 - 8.1 g/dL  6.8  7.3   Total Bilirubin 0.3 - 1.2 mg/dL  1.2  0.6   Alkaline Phos 38 - 126 U/L  73  83   AST 15 - 41 U/L  18  16   ALT 0 - 44 U/L  9  9       RADIOGRAPHIC  STUDIES: I have personally reviewed the radiological images as listed and agreed with the findings in the report. No results found.

## 2022-05-06 NOTE — Assessment & Plan Note (Signed)
B12 is borderline, increase MMA.  Recommend patient to take B12 1021mg daily.

## 2022-05-06 NOTE — Assessment & Plan Note (Signed)
Today's blood work showed improved Hb level. Retic hemoglobin level improved, still low, consistent iron deficiency anemia.  Recommend additional IV venofer weekly x 3 Follow up in 3 months.

## 2022-05-07 ENCOUNTER — Telehealth: Payer: Self-pay

## 2022-05-07 NOTE — Telephone Encounter (Signed)
-----   Message from [name], MD sent at 05/06/2022 12:24 PM EST ----- Please arrange pt to get IV venofer weekly x 3.  Follow up in 3 months, labs prior to MD +/- Venofer  Labs are ordered.

## 2022-05-07 NOTE — Telephone Encounter (Signed)
Pt has been informed of results and follow up plan via Mychart, by provider.   Please schedule and inform pt of appts:   Venofer weekly x3  Labs in 3 months, then MD/ poss venofer 1 - 2 days after labs.

## 2022-05-14 ENCOUNTER — Inpatient Hospital Stay: Payer: BC Managed Care – PPO

## 2022-05-20 ENCOUNTER — Encounter: Payer: Self-pay | Admitting: Certified Registered"

## 2022-05-20 ENCOUNTER — Encounter
Admission: RE | Disposition: A | Discharge status: Home / Self Care | Payer: Self-pay | Source: Home / Self Care | Attending: Gastroenterology

## 2022-05-20 ENCOUNTER — Ambulatory Visit
Admission: RE | Admit: 2022-05-20 | Discharge: 2022-05-20 | Disposition: A | Discharge status: Home / Self Care | Payer: BC Managed Care – PPO | Attending: Gastroenterology | Admitting: Gastroenterology

## 2022-05-20 DIAGNOSIS — D509 Iron deficiency anemia, unspecified: Secondary | ICD-10-CM | POA: Diagnosis not present

## 2022-05-20 DIAGNOSIS — D500 Iron deficiency anemia secondary to blood loss (chronic): Secondary | ICD-10-CM | POA: Diagnosis not present

## 2022-05-20 HISTORY — PX: GIVENS CAPSULE STUDY: SHX5432

## 2022-05-20 SURGERY — IMAGING PROCEDURE, GI TRACT, INTRALUMINAL, VIA CAPSULE

## 2022-05-21 ENCOUNTER — Inpatient Hospital Stay: Payer: BC Managed Care – PPO

## 2022-05-21 ENCOUNTER — Encounter: Payer: Self-pay | Admitting: Gastroenterology

## 2022-05-21 VITALS — BP 137/60 | HR 92 | Temp 98.5°F | Resp 18

## 2022-05-21 DIAGNOSIS — D508 Other iron deficiency anemias: Secondary | ICD-10-CM

## 2022-05-21 DIAGNOSIS — D509 Iron deficiency anemia, unspecified: Secondary | ICD-10-CM | POA: Diagnosis not present

## 2022-05-21 MED ORDER — SODIUM CHLORIDE 0.9 % IV SOLN
Freq: Once | INTRAVENOUS | Status: AC
  Filled 2022-05-21: qty 250

## 2022-05-21 MED ORDER — SODIUM CHLORIDE 0.9 % IV SOLN
200.0000 mg | Freq: Once | INTRAVENOUS | Status: AC
  Administered 2022-05-21: 200 mg via INTRAVENOUS
  Filled 2022-05-21: qty 200

## 2022-05-21 NOTE — Patient Instructions (Signed)

## 2022-05-28 ENCOUNTER — Inpatient Hospital Stay: Payer: BC Managed Care – PPO

## 2022-05-28 VITALS — BP 123/57 | HR 79 | Temp 96.6°F | Resp 16

## 2022-05-28 DIAGNOSIS — D508 Other iron deficiency anemias: Secondary | ICD-10-CM

## 2022-05-28 DIAGNOSIS — D509 Iron deficiency anemia, unspecified: Secondary | ICD-10-CM | POA: Diagnosis not present

## 2022-05-28 MED ORDER — SODIUM CHLORIDE 0.9 % IV SOLN
Freq: Once | INTRAVENOUS | Status: AC
  Filled 2022-05-28: qty 250

## 2022-05-28 MED ORDER — SODIUM CHLORIDE 0.9 % IV SOLN
200.0000 mg | Freq: Once | INTRAVENOUS | Status: AC
  Administered 2022-05-28: 200 mg via INTRAVENOUS
  Filled 2022-05-28: qty 200

## 2022-05-28 NOTE — Patient Instructions (Signed)

## 2022-06-04 ENCOUNTER — Inpatient Hospital Stay: Payer: BC Managed Care – PPO | Attending: Oncology

## 2022-06-04 VITALS — BP 124/68 | HR 72 | Temp 96.9°F | Resp 18

## 2022-06-04 DIAGNOSIS — D508 Other iron deficiency anemias: Secondary | ICD-10-CM

## 2022-06-04 DIAGNOSIS — E538 Deficiency of other specified B group vitamins: Secondary | ICD-10-CM | POA: Diagnosis present

## 2022-06-04 DIAGNOSIS — D509 Iron deficiency anemia, unspecified: Secondary | ICD-10-CM | POA: Diagnosis present

## 2022-06-04 MED ORDER — SODIUM CHLORIDE 0.9 % IV SOLN
200.0000 mg | Freq: Once | INTRAVENOUS | Status: AC
  Administered 2022-06-04: 200 mg via INTRAVENOUS
  Filled 2022-06-04: qty 200

## 2022-06-04 MED ORDER — SODIUM CHLORIDE 0.9 % IV SOLN
Freq: Once | INTRAVENOUS | Status: AC
  Filled 2022-06-04: qty 250

## 2022-06-04 NOTE — Patient Instructions (Signed)

## 2022-06-10 ENCOUNTER — Encounter: Payer: Self-pay | Admitting: Gastroenterology

## 2022-08-07 ENCOUNTER — Inpatient Hospital Stay: Payer: BC Managed Care – PPO

## 2022-08-10 ENCOUNTER — Inpatient Hospital Stay: Payer: BC Managed Care – PPO | Admitting: Oncology

## 2022-08-10 ENCOUNTER — Inpatient Hospital Stay: Payer: BC Managed Care – PPO

## 2022-08-28 ENCOUNTER — Inpatient Hospital Stay: Payer: BC Managed Care – PPO | Attending: Oncology

## 2022-08-28 MED FILL — Iron Sucrose Inj 20 MG/ML (Fe Equiv): INTRAVENOUS | Qty: 10 | Status: AC

## 2022-08-31 ENCOUNTER — Inpatient Hospital Stay: Payer: BC Managed Care – PPO

## 2022-08-31 ENCOUNTER — Inpatient Hospital Stay: Payer: BC Managed Care – PPO | Attending: Oncology | Admitting: Oncology

## 2023-03-01 ENCOUNTER — Encounter: Payer: Self-pay | Admitting: Oncology
